# Patient Record
Sex: Male | Born: 1945 | Race: White | Hispanic: No | Smoking: Former smoker
Health system: Southern US, Community
[De-identification: ages and names within clinical notes are randomized; demographics above are authoritative.]

## PROBLEM LIST (undated history)

## (undated) DIAGNOSIS — M109 Gout, unspecified: Secondary | ICD-10-CM

## (undated) DIAGNOSIS — N529 Male erectile dysfunction, unspecified: Secondary | ICD-10-CM

## (undated) DIAGNOSIS — E118 Type 2 diabetes mellitus with unspecified complications: Secondary | ICD-10-CM

## (undated) DIAGNOSIS — C439 Malignant melanoma of skin, unspecified: Secondary | ICD-10-CM

## (undated) DIAGNOSIS — I10 Essential (primary) hypertension: Secondary | ICD-10-CM

## (undated) DIAGNOSIS — G4733 Obstructive sleep apnea (adult) (pediatric): Secondary | ICD-10-CM

## (undated) DIAGNOSIS — Z720 Tobacco use: Secondary | ICD-10-CM

## (undated) DIAGNOSIS — I2510 Atherosclerotic heart disease of native coronary artery without angina pectoris: Secondary | ICD-10-CM

## (undated) DIAGNOSIS — I739 Peripheral vascular disease, unspecified: Secondary | ICD-10-CM

## (undated) HISTORY — DX: Atherosclerotic heart disease of native coronary artery without angina pectoris: I25.10

## (undated) HISTORY — DX: Essential (primary) hypertension: I10

## (undated) HISTORY — DX: Gout, unspecified: M10.9

## (undated) HISTORY — DX: Malignant melanoma of skin, unspecified: C43.9

## (undated) HISTORY — DX: Type 2 diabetes mellitus with unspecified complications: E11.8

## (undated) HISTORY — DX: Tobacco use: Z72.0

## (undated) HISTORY — DX: Obstructive sleep apnea (adult) (pediatric): G47.33

## (undated) HISTORY — DX: Peripheral vascular disease, unspecified: I73.9

## (undated) HISTORY — DX: Male erectile dysfunction, unspecified: N52.9

---

## 2019-06-20 LAB — DERMATOPATHOLOGY: ICD9 Code: 172.3

## 2019-07-23 NOTE — Progress Notes (Signed)
Patient contacted by RN or has completed COVID-19 self-assessment online and screened negative on questionnaires. COVID testing and ambulatory referral to COVID testing appointment placed.      Schedule COVID testing on 9/18 or 9/19 (3-4 days prior to pre-procedure/admission date) for Dermatology procedure scheduled on 08/02/19.

## 2019-07-29 ENCOUNTER — Ambulatory Visit: Admit: 2019-07-29 | Discharge: 2019-07-29 | Payer: MEDICARE

## 2019-07-29 DIAGNOSIS — Z1159 Encounter for screening for other viral diseases: Secondary | ICD-10-CM

## 2019-07-30 LAB — COVID-19 RNA, RT-PCR/NUCLEIC A: COVID-19 RNA, RT-PCR/Nucleic A: NOT DETECTED

## 2019-08-02 ENCOUNTER — Ambulatory Visit: Admit: 2019-08-02 | Discharge: 2019-08-02 | Payer: MEDICARE

## 2019-08-02 DIAGNOSIS — Z0389 Encounter for observation for other suspected diseases and conditions ruled out: Secondary | ICD-10-CM

## 2019-08-02 DIAGNOSIS — D0339 Melanoma in situ of other parts of face: Secondary | ICD-10-CM

## 2019-08-02 NOTE — Patient Instructions (Signed)
[facility] PATIENT INSTRUCTIONS     CARING FOR YOUR SURGICAL WOUND SITE WITH STERI-STRIPS    1) Your surgical wound will be covered by a pressure dressing.  Remove the pressure dressing after 24 hours.  If you are taking blood thinners, remove the dressing after 48 hours    2) Steri-strips were placed over the wound.  You can replace these only as needed.  You will NOT need to clean the wound with hydrogen peroxide or apply petrolatum.  In case of dissolvable sutures are used, steri-strips can be removed after 7-10 days     3) Do not get your wound wet for the first 24 hours.  The wound and steri-stips can get wet in the shower but dont let the direct stream of the shower hit the wound (indirect gentle stream from the shower is fine)     4) After 24-48 hours, the wound can get wet in the shower, but it should not be submerged in water as long as the sutures are in     5) If you have discomfort following surgery, take Tylenol (acetaminophen) or Advil (ibuprofen) as directed    6) Rest.  Avoid strenuous exercise, bending, straining, stopping or lifting heavy objects during the first few days after surgery.  Activities that increase blood pressure may cause bleeding at the wound site    7) You may apply an ice pack for 10-15 minutes of every hour while awake for the first 24-48 hours after surgery.  This can be applied directly over the pressure dressing and can help keep the swelling/bruising down    8) Be aware that swelling and bruising can be most pronounced on the 3rd day    9) Keeping your wound elevated for the first 24 hours can also help reduce swelling    10) Your wound may begin to drain fluid or bleed within the first few hours of surgery.  If significant bleeding occurs that soaks the dressing or leaks from the dressing, remove the dressing and apply direct pressure to the bleeding site with clean gauze or dressing.  Keep constant pressure on the site for 15 minutes without removing the new  dressing.  If bleeding continues after two 15-minute cycles of applied pressure, call the number below    11) Follow-up as directed for suture removal or wound checks with your doctor    12) If you have any problem or require additional information, call [phone].  If you need urgent assistance after office hours, identify yourself as a dermatology patient, tell the operator the name of your dermatologist, and ask for the doctor on call to be paged.  The doctor will call you back

## 2019-08-02 NOTE — Progress Notes (Signed)
Chief Complaint   Patient presents with    Mohs Surgery     MIS L NASAL SIDEWALL      Juan Roberts is a 73 y.o. male who presents today with a several month history of a lesion on the left nasal ala.    The patient describes the following features of the lesion which led to a referral for evaluation: unusual color    Prior treatments attempted: None    The patient is referred by Ravinder Towanda Octave, MD to the [facility] for evaluation and management. 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    Prior history of skin cancer:  Melanoma Yes   Nonmelanoma skin cancer Yes     Tobacco use:  reports that he has quit smoking. He does not have any smokeless tobacco history on file.    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  History of Hepatitis, HIV, Immunosuppression: None      Review of Systems  Constitutional: Negative for appetite change, chills, fatigue, malaise, sweats, and unexpected weight change  Eyes: Negative for visual disturbance   Neurological: Negative for facial asymmetry    Physical Examination:  General appearance: Well-appearing, well nourished, with no obvious deformities  Orientation: Patient oriented to person, place, and time  Mood and Affect: No evidence of depression, mania, or agitation    Physical exam was significant for a 0.9 x 0.6 cm pink depressed macule in the site of prior biopsy on left nasal ala    The remainder of the skin elements as noted below were examined and without concerning findings:  Head  Neck  Right Upper Extremity  Left Upper Extremity  Hair  Eyes  Lymph nodes: no regional lymphadenopathy noted    Recent Dermatopathology  (Last 90 days)    Date/Time Component Value    06/15/19 0000 ICD9 Code 172.3    06/15/19 0000 Clinical Text MORPHOLOGY: PIGMENTED MACULE, DDX: NEOPLASM OF UNCERTAIN BEHAVIOR VS. LENTIGO VS. 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      06/15/19 0000 Diagnosis Comment The Melan-A component a PRAME/Melan-A multiplex immunostain highlights an irregular distribution of single melanocytes along the dermal-epidermal junction with extension along follicular epithelium, and shows strong, uniform staining of the nuclei of the   lesional melanocytes for PRAME, favoring early melanoma in situ.  The proliferation extends to the peripheral tissue edge.      06/15/19 0000 Diagnosis Comment --    06/15/19 0000 Diagnosis Comment The case has been reviewed with Dr. Ellie Lunch who concurs with the diagnosis of melanoma in situ.    06/15/19 0000 Diagnosis Comment --    06/15/19 0000 Diagnosis Comment The findings were discussed with Dr. Wille Glaser on 06/20/2019.    06/15/19 0000 Diagnosis Comment --    06/15/19 0000 Diagnosis Comment --    06/15/19 0000 Diagnosis Comment --    06/15/19 0000 Gross Text Skin segment measuring 0.5 x 0.4 x 0.1 cm, bisected and entirely submitted. CF/KB    06/15/19 0000 Microscopic Description Sections show an irregular, disorganized intraepidermal prolfieration of mostly single melanocytes along the dermal-epidermal junction, which is partially effaced in some areas, and extending along follicular epithelium as well.  There is prominent solar   elastosis in the subjacent superficial dermis. The lesion extends to the peripheral  tissue edge.      06/15/19 0000 Case Comment THE IMMUNOPEROXIDASE STAINING OR IN SITU HYBRIDIZATION TESTING DELINEATED ABOVE WAS DEVELOPED AND THE PERFORMANCE CHARACTERISTICS OF THIS TESTING WERE DETERMINED AT THE [facility] OR AT THE [facility]   DEPARTMENT OF PATHOLOGY. THIS TYPE OF TESTING HAS NOT BEEN APPROVED BY THE [place] [employer], ALTHOUGH THE FDA HAS DETERMINED THAT APPROVAL OR CLEARANCE IS NOT NECESSARY FOR  SUCH TESTING TO BE PERFORMED. THIS TESTING IS USED FOR   CLINICAL PURPOSES AND SHOULD NOT BE CONSIDERED INVESTIGATIONAL OR RESEARCH-RELATED. OUR LABORATORY IS REGULATED UNDER THE CLINICAL LABORATORY IMPROVEMENT ACT (CLIA) AND IS QUALIFIED TO PERFORM HIGH COMPLEXITY CLINICAL TESTING OF THIS TYPE.            Assessment and Plan    1. 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  Various treatment options, including Mohs micrographic surgery, wide local excision, curettage and electrodesiccation, and topical immunomodulators, were discussed with the patient.  Based on the anatomic location, histology and tumor size, Mohs micrographic surgery was recommended.  The technique, benefits, risks, and alternatives, including but not limited to infection, bleeding, recurrence, nerve injury, and scar, were discussed with the patient.  Following the opportunity to answer all his questions, the decision was made to treat his skin cancer with Mohs micrographic surgery.  Mohs AUC score is 7.    Counseling:  Risks and benefits of surgery were discussed. The patient wished to proceed with Mohs micrographic surgery as outlined above.     OPERATIVE NOTE  Mohs Case #:    [id number]    This 73 y.o. patient was referred for the treatment of primary melanoma in situ located on the left nasal ala.  Mohs surgery is indicated due to anatomic location and histology.  The preoperative size was 0.9 x 0.6 cm. The technique, its benefits, alternatives, and risks were discussed with the patient.  The patient was identified per protocol, appropriate time-out was performed, and the location of the lesion was confirmed per site identification protocol, including confirmation with the patient.  After informed consent and appropriate instruction, the patient underwent Mohs surgery using fresh tissue technique as follows.    The visible tumor was debulked and sent to  pathology for vertical sections. As the biopsy was only a partial sampling of a larger pigmented lesion, the full thickness/depth of the tumor may have been under-reported on the biopsy specimen. Mohs surgery represents an appropriate treatment for melanoma in situ. However, if the debulked tumor demonstrates invasion upon review of vertical sections by dermatopathology, a further excision with 1cm margins and potentially a sentinel lymph node biopsy may be indicated. Moreover, the presence or absence of invasive melanoma would also provide prognostic information. As recently reported by [facility], evaluation of debulking specimens with vertical sections resulted in upstaging of the tumor in more than 8% of cases. (Dermatol Surg. 2013 Mar;39(3 Pt 1):365-71)    Excisional biopsy: Consent was obtained for a diagnostic biopsy of 1 (qty) lesions located on the left nasal ala.  The area(s) were cleansed and anesthetized with 1% lidocaine with epi, and visible tumor was excised for diagnostic purposes.  The specimen(s) were sent for histopathologic evaluation.  Mohs surgery was then performed to clear the margins (see procedure note). The attending was present and directly participated in the entire procedure.  STAGE I:  The patient was placed supine on the operative table.  The lesion was defined and infiltrated with 1% buffered lidocaine with epinephrine.  An initial Mohs excision was made 5 mm from the tumor margins as defined by Wood's lamp.  Hemostasis was obtained by spot electrodesiccation and a dressing was placed.  The tissue was submitted as 1 section(s) which was mapped, color coded at their margins, and sent to the technician who produced frozen section slides using H&E and MART1 staning.  Histologic exam of these slides was performed by the Mohs surgeon, and no microscopic tumor identified.  Following complete analysis of all frozen sections, microscopic tumor was found persisting in 0  specimen(s).    With  the lesions clear of microscopic tumor, surgery was considered complete.  Following surgery, the defect measured as follows: 1.0 x 1.1 cm.     CONDITION AT TERMINATION OF THERAPY:  Carcinoma removed.    The attending was present and directly participated in the entire procedure    RECONSTRUCTION NOTE    The patient was left with the above described defect  following Mohs surgery. We discussed various closure modalities with the patient, including healing by second intention, primary closure, skin graft and various flaps, and felt that the location and configuration of the defect indicated that a rotation flap would result in the least disturbance of the position and function of the surrounding anatomic structures and provide the best result.  The technique, its benefits, alternatives and risks were discussed with the patient.  Appropriate instructions were given, informed consent was obtained, and then the patient underwent the procedure as follows:    PROCEDURE:  The patient was taken to the operative suite and placed supine on the operating room table.  The area of the defect and the surrounding skin were anesthetized with buffered 1% lidocaine with epinephrine.  The area was washed with chlorhexidine.  Sterile drapes were applied.      The wound edges were debeveled and extensively undermined.  Hemostasis was achieved with spot electrodesiccation.  A rotation flap was designed, incised, and elevated.  The flap was rotated into position to cover the primary defect and a 5-0 Vicryl buried key suture was used to secure the flap in place.  Further dermal wound apposition was achieved using 5-0 Vicryl buried interrupted sutures.  The standing cone created at the point of rotation was removed to fat by triangulation.  Final cutaneous approximation was achieved with 6-0 Prolene sutures.  The closure was manually tested and found to be sound for strength and hemostasis.  The final post-operative defect size, which  included both the primary defect and the secondary flap defect, was 8.1 sq. cm..    A sterile dressing was applied.  Postoperative wound care instructions, along with a written handout, were given to the patient.  The patient was discharged from the [facility] and [facility] alert and ambulatory.      FINAL PROCEDURE:  Rotation flap    COMPLICATIONS:  None      The attending was present and directly participated in the entire procedure.     The tissue processing, slide preparation, and pathologic analysis of the frozen tissue during the Mohs surgery was performed at the [facility], [address] [place], [place], [address].   Jerene Bears. Grekin MD, Laboratory Director           Follow up 1 week for suture removal

## 2019-08-03 LAB — DERMATOPATHOLOGY: ICD9 Code: 172.3

## 2019-08-09 ENCOUNTER — Ambulatory Visit: Admit: 2019-08-09 | Discharge: 2019-08-09 | Payer: MEDICARE

## 2019-08-09 DIAGNOSIS — Z5189 Encounter for other specified aftercare: Secondary | ICD-10-CM

## 2019-08-09 NOTE — Progress Notes (Signed)
Chief Complaint   Patient presents with    Suture / Staple Removal     MFU- MIS L nasal wall- DOS 08/02/19     Juan Roberts is a 73 y.o. male s/p Mohs surgery for melanoma in situ on the left nasal ala on 08/02/19 who presents today in follow up. The defect was repaired with rotation flap. Today, the patient reports the wound has continued to heal well. Reports no bleeding, tenderness, purulence, or other issues.     Denies other new, changing, or symptomatic skin lesions.     Prior history of skin cancer:  Melanoma Yes   Nonmelanoma skin cancer Yes       Review of Systems  Constitutional: Negative for appetite change, chills, fatigue, malaise, sweats, and unexpected weight change  Neurological: Negative for facial asymmetry    Physical Examination:  General appearance: Well-appearing, well nourished, with no obvious deformities  Orientation: Patient oriented to person, place, and time  Mood and Affect: No evidence of depression, mania, or agitation    Physical exam was significant for a well healing surgical scar on the left nose with no erythema, tenderness, or purulence. Superficial crusting at the inferior aspect    The remainder of the skin elements as noted below were examined and without concerning findings:  Head  Neck  Hair  Eyes    Assessment and Plan    1. Melanoma in situ on left nasal ala s/p Mohs surgery on 08/02/19 with rotation flap:    - The wound is well healed without signs of infection.    - Wound care and activity instructions reinforced.    - No further intervention needed at this time.    - Continue routine skin checks with the referring dermatologist.    - Discussed the potential that the inferior edge may be thickened after surgical scar is healed and may be treated with ILK if needed   - Return prn    Counseling:  Discussed assessment and plan with the patient

## 2019-08-09 NOTE — Addendum Note (Signed)
Addended by: Danella Penton on: 08/16/2019 04:59 PM     Modules accepted: Level of Service

## 2019-08-30 ENCOUNTER — Ambulatory Visit: Admit: 2019-08-30 | Discharge: 2019-08-30 | Payer: MEDICARE

## 2019-08-30 DIAGNOSIS — Z5189 Encounter for other specified aftercare: Secondary | ICD-10-CM

## 2019-08-30 NOTE — Progress Notes (Signed)
S:   Juan Roberts is a 73 y.o. male s/p Mohs surgery for melanoma in situ on the left nasal ala on 08/02/19 who presents today in follow up. Not doing wound care. There is a thick scab on distal nose and he has been leaving it dry.     O:  Distal rotation flap on nasal dorsum with crust overlying granulating wound. Otherwise flap is warm and well perfused, with intact scars.     A/P:  Scar  Melanoma in situ on left nasal ala s/p Mohs surgery on 08/02/19 with rotation flap.  Superficial sloughing of distal rotation flap noted, and this area is now healing by secondary intention.    -Discussed local wound care, including vaseline BID to keep secondary intention area moist  -no evidence of infection    F/U 3 weeks for wound check

## 2019-09-20 ENCOUNTER — Ambulatory Visit: Admit: 2019-09-20 | Discharge: 2019-09-20 | Payer: MEDICARE

## 2019-09-20 DIAGNOSIS — Z4889 Encounter for other specified surgical aftercare: Secondary | ICD-10-CM

## 2019-09-20 DIAGNOSIS — L910 Hypertrophic scar: Secondary | ICD-10-CM

## 2019-09-20 NOTE — Progress Notes (Signed)
Subjective   Chief Complaint   Patient presents with    Wound Check     [facility] - MIS - Left Nasal Ala - Rotation Flap - 7 weeks     History of Present Illness:  Juan Roberts is a 73 y.o. male with HPI as follows:    s/p Mohs surgery for melanoma in situon the left nasal alaon 9/22/20who presents today in follow up.    Flap is healing well. Mild thickening present. No pain, bleeding or signs of infection reported.     Personal Skin Cancer History   Melanoma Yes   Nonmelanoma skin cancer Yes     Details of Personal Skin Cancer Hx     Diagnosis Date Comment Source    Melanoma (CMS code)       Nonmelanoma skin cancer           Family Skin Cancer Hx     Problem Relation (Age of Onset)    Squamous cell carcinoma Neg Hx        Patient's allergies, medications, past medical, surgical, family and social histories were reviewed and updated as appropriate.    Review of Systems   Constitutional: Negative for chills and fever.   Eyes: Negative for visual disturbance.    HENT: Negative for congestion.    Respiratory: Negative for cough.           Objective   Physical Examination:  Skin elements examined without findings:    Neck    R Upper Extremity    L Upper Extremity    Hair    Eyes (Conjunctiva and Lids)    Lips, Teeth, & Gums  Skin elements examined with findings:    Head.  Skin exam findings:    Distal rotation flap on nasal dorsum, well-healed, with mild hypertrophy. Otherwise warm and well perfused.     Additional Exam Findings    General Appearance negative: Well-appearing, well-nourished, with no obvious deformities.     Orientation negative: Oriented to time, place and person.     Mood & Affect negative: No evidence of depression, mania, anxiety, or agitation.     Data Reviewed:  Patient's relevant lab, radiology, micro, and pathology results were reviewed as appropriate.       Assessment   Assessment and Plan (numbered by problem):    1. Melanoma in situon left nasal alas/p Mohs surgeryon 08/02/19 with rotation  flap.  -Flap is well-healed, warm and well perfused.   -Mild hypertrophy noted; patient amenable to treatment with ILK today. See Procedure note below.   -Will return for ErYAG resurfacing/scar revision in a few months.     Minor Procedure Notes (if applicable):  Intralesional Injection: After discussing the R/B/SE/Alt, the patient's hypertrophic rotation flap located on the nose was treated with intralesional kenalog.    Total number of injection sites: 1  Concentration(s) of medication injected: 40 mg/mL  Total volume injected: 0.35 mL  The attending was present and directly participated in the entire procedure.    Counseling:  Assessment and Plan were discussed.  Gentle skin care education discussed

## 2019-11-21 ENCOUNTER — Ambulatory Visit: Admit: 2019-11-21 | Discharge: 2019-11-21 | Payer: MEDICARE

## 2019-11-21 DIAGNOSIS — Z4889 Encounter for other specified surgical aftercare: Secondary | ICD-10-CM

## 2019-11-21 DIAGNOSIS — L910 Hypertrophic scar: Secondary | ICD-10-CM

## 2019-11-21 NOTE — Patient Instructions (Signed)
[facility] PATIENT INSTRUCTIONS     CARING FOR YOUR LASER SITE     1) Swelling and redness of the treated area is extremely common after laser treatment.  Cold packs and keeping the treated area elevated can help to reduce swelling.  This usually resolves in 2-3 days, but may last longer in areas that have been extensively treated    2) Rarely, you may experience crusting or blistering after laser treatment.  If you have any of these symptoms, use a greasy emollient such as petrolatum or Aquaphor 2-3 times a day until the skin has healed.  Until the crusting has resolved, do not use make-up, sunscreen, or topical medications unless directed by your doctor.  It is important to not rub, scratch, or pull off any crust or scabs that occur    3) You can shower and clean the treated area with a gentle cleanser.  Do not rub treated area with face cloth or towel as this may irritate the skin.  Pat the area gently to dry    4) Laser treatment may increase sun sensitivity.  Protect treated areas by using sunscreen, protective clothing, and hats in order to maximize treatment results    5) For discomfort, take acetaminophen (Tylenol) as directed by the instructions on the package    6) If you have any problem or require additional information, call [phone].  If you need urgent assistance after office hours, identify yourself as a dermatology patient, tell the operator the name of your dermatologist, and ask for the doctor on call to be paged.  The doctor will call you back

## 2019-11-21 NOTE — Addendum Note (Signed)
Addended by: Dorinda Hill on: 11/21/2019 06:19 PM     Modules accepted: Level of Service

## 2019-11-21 NOTE — Progress Notes (Signed)
Subjective   Chief Complaint   Patient presents with    Laser Treatment     scar on nose      History of Present Illness:  Juan Roberts is a 74 y.o. male with HPI as follows:    Patient is s/p MMS for MIS on 08/02/19, repaired with rotation flap. Well healed, though some hypertrophy noted, s/p ILK. Plan for Er:YAG scar revision today.     Personal Skin Cancer History   Melanoma Yes   Nonmelanoma skin cancer Yes     Details of Personal Skin Cancer Hx     Diagnosis Date Comment Source    Melanoma (CMS code)       Nonmelanoma skin cancer           Family Skin Cancer Hx     Problem Relation (Age of Onset)    Squamous cell carcinoma Neg Hx        Patient's allergies, medications, past medical, surgical, family and social histories were reviewed and updated as appropriate.    Review of Systems   Constitutional: Negative.    Skin/Breast: Negative for skin pruritus.   Allergic/Immunologic: Negative for frequent infections.        Objective   Physical Examination:  Skin elements examined without findings:    Neck  Skin elements examined with findings:    Head.  Skin exam findings:    Distal rotation flap on nasal dorsum, well-healed, with mild hypertrophy. Most bothered by obvious transition from flap to skin on lateral nose.    Additional Exam Findings    General Appearance negative: Well-appearing, well-nourished, with no obvious deformities.     Orientation negative: Oriented to time, place and person.     Mood & Affect negative: No evidence of depression, mania, anxiety, or agitation.     Data Reviewed:          Assessment   Assessment and Plan (numbered by problem):    1. Hypertrophic scar, s/p MMS on left nose for MIS, repaired with rotation flap  - Plan for Er:YAG today  -- Discussed with patient that the most effective treatment for this hypertrophic scar would be ablative laser treatment (Er:YAG), which results in dramatic improvement and blending of the scar. We reviewed the procedure in detail, including the  technique, downtime, and expected results. Discussed risks of treatment including pain, erythema, erosions, milia formation, dyschromia, reactivation of orolabial herpes, and in rare cases, scarring. The expected down time is about 10 days. Discussed with patient that this area may be erythematous for several months after the procedure, but will gradually fade and can be covered with make-up.       ERBIUM YAG LASER NOTE: FULLY ABLATIVE     Laser: Er:YAG fully ablative resurfacing   Diagnosis: hypertrophic scar on nose s/p MMS and rotation flap repair     Skin preparation: alcohol  Eye protection: shields  Anesthesia: 1% lidocaine with epinephrine nerve blocks and skin infiltration     Location verified: Yes  Techniques, risks, benefits, alternatives reviewed: Yes  Consent signed, witnessed, and dated: Yes     Laser Settings:   Wavelength: 2940 nm  Depth of ablation: 100 microns  Fluence: 25 J/cm2  Rate: 4.0 hz  Spot size 47mm   Coag: 50      Procedure:  A total of three passes were delivered to the left nose, extending ~31mm along either side of the scar. Areas were treated to an endpoint of pinpoint bleeding and  improvement in contour.     Immediate post-laser care: Vaseline and tefla to treated area on nose  Complications: none  Post-op medication: none  Wound care instructions given: Yes    RTC in 2 weeks for f/u

## 2019-12-05 ENCOUNTER — Ambulatory Visit: Admit: 2019-12-05 | Discharge: 2019-12-05 | Payer: MEDICARE

## 2019-12-05 DIAGNOSIS — Z4889 Encounter for other specified surgical aftercare: Secondary | ICD-10-CM

## 2019-12-05 DIAGNOSIS — L905 Scar conditions and fibrosis of skin: Secondary | ICD-10-CM

## 2019-12-05 DIAGNOSIS — L989 Disorder of the skin and subcutaneous tissue, unspecified: Secondary | ICD-10-CM

## 2019-12-05 NOTE — Progress Notes (Signed)
Chief Complaint   Patient presents with    Wound Check     MFU - 4 month post op - MIS L nasal ala - Rotation flap     Juan Roberts is here for f/u s/p fractional Er:YAG to left nasal ala on 11/21/2019. Patient s/p MMS for MIS repaired with rotation flap. Was applying vaseline for the first week, but then stopped. A little crusting along the edge. Pleased with results, feels that the contour is much improved.     Objective:  - Contour improved along left nasal ala  - Crust noted along alar crease  - Pink-red eythema in area of treatment    Assessment and Plan:    1. Wound check, s/p Er:YAG to left nasal ala on 11/21/2019, scar revision  - Continue vaseline to area of crust, daily until healed  - If erythema does not resolve, consider PDL in next 6-8 weeks  - Reiterated the important of sun protection in the treatment area    RTC as needed

## 2019-12-05 NOTE — Addendum Note (Signed)
Addended by: Taivon Haroon C on: 12/08/2019 01:54 PM     Modules accepted: Level of Service

## 2020-12-12 ENCOUNTER — Other Ambulatory Visit: Payer: Self-pay

## 2020-12-12 ENCOUNTER — Ambulatory Visit (INDEPENDENT_AMBULATORY_CARE_PROVIDER_SITE_OTHER): Payer: Medicare Other | Admitting: Cardiology

## 2020-12-12 ENCOUNTER — Other Ambulatory Visit: Payer: Self-pay | Admitting: Pharmacist

## 2020-12-12 ENCOUNTER — Encounter: Payer: Self-pay | Admitting: Cardiology

## 2020-12-12 VITALS — BP 140/80 | HR 57 | Ht 71.0 in | Wt 207.6 lb

## 2020-12-12 DIAGNOSIS — E7800 Pure hypercholesterolemia, unspecified: Secondary | ICD-10-CM | POA: Diagnosis not present

## 2020-12-12 DIAGNOSIS — E785 Hyperlipidemia, unspecified: Secondary | ICD-10-CM

## 2020-12-12 DIAGNOSIS — I2510 Atherosclerotic heart disease of native coronary artery without angina pectoris: Secondary | ICD-10-CM

## 2020-12-12 DIAGNOSIS — I739 Peripheral vascular disease, unspecified: Secondary | ICD-10-CM

## 2020-12-12 DIAGNOSIS — E119 Type 2 diabetes mellitus without complications: Secondary | ICD-10-CM | POA: Diagnosis not present

## 2020-12-12 DIAGNOSIS — I10 Essential (primary) hypertension: Secondary | ICD-10-CM | POA: Diagnosis not present

## 2020-12-12 MED ORDER — HYDROCHLOROTHIAZIDE 25 MG PO TABS: 25.0000 mg | ORAL_TABLET | Freq: Every day | ORAL | 3 refills | Status: DC

## 2020-12-12 MED ORDER — EVOLOCUMAB 140 MG/ML ~~LOC~~ SOAJ: 1.0000 mL | SUBCUTANEOUS | 3 refills | Status: DC

## 2020-12-12 NOTE — Patient Instructions (Signed)
Medication Instructions:  Your physician has recommended you make the following change in your medication:   INCREASE: Hydrochlorothiazide to 25 mg once a day  *If you need a refill on your cardiac medications before your next appointment, please call your pharmacy*   Lab Work: None  If you have labs (blood work) drawn today and your tests are completely normal, you will receive your results only by: Marland Kitchen MyChart Message (if you have MyChart) OR . A paper copy in the mail If you have any lab test that is abnormal or we need to change your treatment, we will call you to review the results.   Testing/Procedures: Your physician has requested that you have an echocardiogram. Echocardiography is a painless test that uses sound waves to create images of your heart. It provides your doctor with information about the size and shape of your heart and how well your heart's chambers and valves are working. This procedure takes approximately one hour. There are no restrictions for this procedure.   Follow-Up: At [facility], you and your health needs are our priority.  As part of our continuing mission to provide you with exceptional heart care, we have created designated Provider Care Teams.  These Care Teams include your primary Cardiologist (physician) and Advanced Practice Providers (APPs -  Physician Assistants and Nurse Practitioners) who all work together to provide you with the care you need, when you need it.  We recommend signing up for the patient portal called "MyChart".  Sign up information is provided on this After Visit Summary.  MyChart is used to connect with patients for Virtual Visits (Telemedicine).  Patients are able to view lab/test results, encounter notes, upcoming appointments, etc.  Non-urgent messages can be sent to your provider as well.   To learn more about what you can do with MyChart, go to [email or web address].    Your next appointment:   6 month(s)  The format  for your next appointment:   In Person  Provider:   You may see Gwyndolyn Kaufman, MD or one of the following Advanced Practice Providers on your designated Care Team:    Richardson Dopp, PA-C  Robbie Lis, [place]    Other Instructions None

## 2020-12-12 NOTE — Progress Notes (Signed)
Cardiology Office Note:    Date:  12/12/2020   ID:  Tellis Dossey, DOB 1946/08/27, MRN [id number]  PCP:  [name], MD  [facility] Cardiologist:  No primary care provider on file.  [facility] Electrophysiologist:  None   Referring MD: [name], MD    History of Present Illness:    Kristopher Rowe is a 75 y.o. male with a hx of CAD s/p remote history of PCI to RCA and OM1, DMII, HTN, PAD s/p PTA and stent of left common iliac (2016), prior tobacco abuse, OSA, and gout who was referred by Dr. Mannie Stabile for further management of underlying CAD.  Patient recently moved to [place] from [place] and is looking to establish care. Has extensive coronary history with history of RCA stent and OM1 in 2012. Also has left common iliac stenting in 2016 for severe claudication. Has been doing well since that time. No exertional chest pain, SOB, claudication, or lightheadedness. Plays raquetball ball 5-6 times per week without issues. No lightheadedness, dizziness, syncope. Occasional LE edema. No bleeding issues.   Blood pressure has been elevated in 140s. On valsartan 160mg , atenolol 50mg  daily, and HCTZ 12.5mg . Does not know the last time he had an echo.   Past Medical History:  Diagnosis Date  . CAD (coronary artery disease)   . DM (diabetes mellitus) with complications ([place])   . ED (erectile dysfunction)   . Gout   . Hypertension   . Melanoma ([place])   . OSA (obstructive sleep apnea)   . PAD (peripheral artery disease) ([place])   . Tobacco use     History reviewed. No pertinent surgical history.  Current Medications: Current Meds  Medication Sig  . allopurinol (ZYLOPRIM) 300 MG tablet Take 300 mg by mouth daily.  Marland Kitchen aspirin EC 81 MG tablet Take 81 mg by mouth daily. Swallow whole.  Marland Kitchen atenolol (TENORMIN) 50 MG tablet Take 50 mg by mouth daily.  . celecoxib (CELEBREX) 50 MG capsule Take 50 mg by mouth 2 (two) times daily.  . cholecalciferol (VITAMIN D) 25 MCG (1000 UNIT) tablet  Take 1,000 Units by mouth daily.  . fenofibrate 160 MG tablet Take 160 mg by mouth daily.  . metFORMIN (GLUCOPHAGE) 500 MG tablet Take by mouth 2 (two) times daily with a meal.  . valsartan (DIOVAN) 160 MG tablet Take 160 mg by mouth daily.  . varenicline (CHANTIX) 1 MG tablet Take 1 mg by mouth 2 (two) times daily.  . [DISCONTINUED] Evolocumab (REPATHA [place]) Inject into the skin. Per patient taking every 2 weeks  . [DISCONTINUED] hydrochlorothiazide (HYDRODIURIL) 12.5 MG tablet Take 12.5 mg by mouth daily.     Allergies:   Penicillins   Social History   Socioeconomic History  . Marital status: Divorced    Spouse name: Not on file  . Number of children: Not on file  . Years of education: Not on file  . Highest education level: Not on file  Occupational History  . Not on file  Tobacco Use  . Smoking status: Former Research scientist (life sciences)  . Smokeless tobacco: Never Used  Substance and Sexual Activity  . Alcohol use: Not on file  . Drug use: Not on file  . Sexual activity: Not on file  Other Topics Concern  . Not on file  Social History Narrative  . Not on file   Social Determinants of Health   Financial Resource Strain: Not on file  Food Insecurity: Not on file  Transportation Needs: Not on file  Physical Activity: Not on file  Stress: Not on file  Social Connections: Not on file     Family History: The patient's family history includes Hodgkin's lymphoma in his father; Other in his mother.  ROS:   Please see the history of present illness.    Review of Systems  Constitutional: Negative for chills and fever.  HENT: Negative for nosebleeds.   Eyes: Negative for blurred vision.  Respiratory: Negative for shortness of breath.   Cardiovascular: Negative for chest pain, palpitations, orthopnea, claudication, leg swelling and PND.  Gastrointestinal: Negative for melena, nausea and vomiting.  Genitourinary: Negative for hematuria.  Musculoskeletal: Positive for joint pain.   Neurological: Negative for dizziness and loss of consciousness.  Psychiatric/Behavioral: Negative for substance abuse.    EKGs/Labs/Other Studies Reviewed:    The following studies were reviewed today: Patient brought records today which were reviewed.  2016: Left iliac artery stenting 1999: RCA stent 2012: OM1 stent 2009: Myoview with inferior infarct but no ischemia  EKG:  EKG is  ordered today.  The ekg ordered today demonstrates sinus bradycardia with HR 57; inferior q-waves  Recent Labs: No results found for requested labs within last 8760 hours.  Recent Lipid Panel No results found for: CHOL, TRIG, HDL, CHOLHDL, VLDL, LDLCALC, LDLDIRECT    Physical Exam:    VS:  BP 140/80   Pulse (!) 57   Ht 5\' 11"  (1.803 m)   Wt 207 lb 9.6 oz (94.2 kg)   SpO2 97%   BMI 28.95 kg/m     Wt Readings from Last 3 Encounters:  12/12/20 207 lb 9.6 oz (94.2 kg)     GEN:  Well nourished, well developed in no acute distress HEENT: Normal NECK: No JVD; No carotid bruits CARDIAC: RRR, 1/6 systolic murmur. No rubs, gallops RESPIRATORY:  Clear to auscultation without rales, wheezing or rhonchi  ABDOMEN: Soft, non-tender, non-distended MUSCULOSKELETAL:  No edema; No deformity  SKIN: Warm and dry NEUROLOGIC:  Alert and oriented x 3 PSYCHIATRIC:  Normal affect   ASSESSMENT:    1. Coronary artery disease involving native coronary artery of native heart without angina pectoris   2. Primary hypertension   3. Pure hypercholesterolemia   4. Type 2 diabetes mellitus without complication, without long-term current use of insulin ([place])   5. Peripheral arterial disease (HCC)    PLAN:    In order of problems listed above:  #Multivessel CAD s/p remote history of PCI to RCA and OM1: Previously followed in [place] with history of PCI to RCA and OM1. No current anginal symptoms. He is active and continues to play racquetball 5-6 days per week without issues.  -Check TTE to assess  LVEF -Continue ASA 81mg  daily -Did not tolerate lipitor, crestor or simvastatin in the past; will resume home repatha -Continue atenolol 50mg  daily -Continue Valsartan 160mg  daily  #PAD s/p stenting to left iliac artery: Doing well without claudication symptoms. -Continue ASA and rapatha -Continue healthy lifestyle with diet and exercise  #HTN: Elevated today and has been running high in 140s at home. -Increase HCTZ to 25mg  daily -Continue valsartan 160mg  daily -Continue atenolol 50mg  daily -Discussed importance of low Na diet  #HLD: Has been well controlled on rapatha. Failed more than 3 statins in the past. -Renew script for repatha  #DMII: -Continue metformin  #Tobacco Abuse: -Continue varenicline   Medication Adjustments/Labs and Tests Ordered: Current medicines are reviewed at length with the patient today.  Concerns regarding medicines are outlined above.  Orders Placed This  Encounter  Procedures  . EKG 12-Lead  . ECHOCARDIOGRAM COMPLETE   Meds ordered this encounter  Medications  . hydrochlorothiazide (HYDRODIURIL) 25 MG tablet    Sig: Take 1 tablet (25 mg total) by mouth daily.    Dispense:  90 tablet    Refill:  3    Patient Instructions  Medication Instructions:  Your physician has recommended you make the following change in your medication:   INCREASE: Hydrochlorothiazide to 25 mg once a day  *If you need a refill on your cardiac medications before your next appointment, please call your pharmacy*   Lab Work: None  If you have labs (blood work) drawn today and your tests are completely normal, you will receive your results only by: Marland Kitchen MyChart Message (if you have MyChart) OR . A paper copy in the mail If you have any lab test that is abnormal or we need to change your treatment, we will call you to review the results.   Testing/Procedures: Your physician has requested that you have an echocardiogram. Echocardiography is a painless test that  uses sound waves to create images of your heart. It provides your doctor with information about the size and shape of your heart and how well your heart's chambers and valves are working. This procedure takes approximately one hour. There are no restrictions for this procedure.   Follow-Up: At [facility], you and your health needs are our priority.  As part of our continuing mission to provide you with exceptional heart care, we have created designated Provider Care Teams.  These Care Teams include your primary Cardiologist (physician) and Advanced Practice Providers (APPs -  Physician Assistants and Nurse Practitioners) who all work together to provide you with the care you need, when you need it.  We recommend signing up for the patient portal called "MyChart".  Sign up information is provided on this After Visit Summary.  MyChart is used to connect with patients for Virtual Visits (Telemedicine).  Patients are able to view lab/test results, encounter notes, upcoming appointments, etc.  Non-urgent messages can be sent to your provider as well.   To learn more about what you can do with MyChart, go to [email or web address].    Your next appointment:   6 month(s)  The format for your next appointment:   In Person  Provider:   You may see Gwyndolyn Kaufman, MD or one of the following Advanced Practice Providers on your designated Care Team:    Richardson Dopp, PA-C  Robbie Lis, [place]    Other Instructions None     Signed, Freada Bergeron, MD  12/12/2020 5:08 [place]

## 2020-12-13 ENCOUNTER — Ambulatory Visit: Payer: Self-pay | Admitting: Cardiology

## 2020-12-13 ENCOUNTER — Telehealth: Payer: Self-pay

## 2020-12-13 NOTE — Telephone Encounter (Signed)
alled and lmom pt that we need drug insurance for repatha because it requires a prior [place]

## 2020-12-13 NOTE — Telephone Encounter (Signed)
Called and spoke w/pt stated that they were approved for repatha and tried to get them signed up for [employer] grant but they are required to call for income verification. Pt voiced understanding

## 2020-12-25 ENCOUNTER — Other Ambulatory Visit (HOSPITAL_BASED_OUTPATIENT_CLINIC_OR_DEPARTMENT_OTHER): Payer: Self-pay

## 2020-12-25 DIAGNOSIS — R0681 Apnea, not elsewhere classified: Secondary | ICD-10-CM

## 2020-12-25 DIAGNOSIS — R0683 Snoring: Secondary | ICD-10-CM

## 2020-12-25 DIAGNOSIS — G4710 Hypersomnia, unspecified: Secondary | ICD-10-CM

## 2021-01-01 ENCOUNTER — Encounter (HOSPITAL_COMMUNITY): Payer: Self-pay | Admitting: Cardiology

## 2021-01-01 ENCOUNTER — Other Ambulatory Visit (HOSPITAL_COMMUNITY): Payer: Medicare Other

## 2021-01-08 ENCOUNTER — Other Ambulatory Visit (HOSPITAL_COMMUNITY): Payer: Medicare Other

## 2021-01-24 ENCOUNTER — Ambulatory Visit (HOSPITAL_COMMUNITY): Payer: Medicare Other | Attending: Cardiology

## 2021-01-24 ENCOUNTER — Other Ambulatory Visit: Payer: Self-pay

## 2021-01-24 DIAGNOSIS — I2510 Atherosclerotic heart disease of native coronary artery without angina pectoris: Secondary | ICD-10-CM | POA: Diagnosis present

## 2021-01-24 LAB — ECHOCARDIOGRAM COMPLETE
Area-P 1/2: 1.78 cm2
S' Lateral: 3.5 cm

## 2021-01-25 ENCOUNTER — Telehealth: Payer: Self-pay | Admitting: Cardiology

## 2021-01-25 NOTE — Telephone Encounter (Signed)
Patient returning phone call for Echo results

## 2021-01-25 NOTE — Telephone Encounter (Signed)
Pt has been notified of echo results by phone with verbal understanding. Pt thanked me for the call.

## 2021-02-07 ENCOUNTER — Other Ambulatory Visit: Payer: Self-pay

## 2021-02-07 ENCOUNTER — Ambulatory Visit (HOSPITAL_BASED_OUTPATIENT_CLINIC_OR_DEPARTMENT_OTHER): Payer: Medicare Other | Attending: Internal Medicine | Admitting: Internal Medicine

## 2021-02-07 DIAGNOSIS — G4710 Hypersomnia, unspecified: Secondary | ICD-10-CM

## 2021-02-07 DIAGNOSIS — R0683 Snoring: Secondary | ICD-10-CM | POA: Diagnosis present

## 2021-02-07 DIAGNOSIS — Z8669 Personal history of other diseases of the nervous system and sense organs: Secondary | ICD-10-CM | POA: Insufficient documentation

## 2021-02-07 DIAGNOSIS — G4733 Obstructive sleep apnea (adult) (pediatric): Secondary | ICD-10-CM | POA: Insufficient documentation

## 2021-02-07 DIAGNOSIS — G4730 Sleep apnea, unspecified: Secondary | ICD-10-CM | POA: Diagnosis present

## 2021-02-07 DIAGNOSIS — R0681 Apnea, not elsewhere classified: Secondary | ICD-10-CM | POA: Insufficient documentation

## 2021-02-10 NOTE — Procedures (Signed)
   NAME: Kristopher Rowe DATE OF BIRTH:  01-28-1946 MEDICAL RECORD NUMBER [id number]  LOCATION: [facility]  PHYSICIAN: Marius Ditch  DATE OF STUDY: 02/07/2021  SLEEP STUDY TYPE: Nocturnal Polysomnogram               REFERRING PHYSICIAN: Loma Sousa, MD  EPWORTH SLEEPINESS SCORE:  8 HEIGHT: 5\' 11"  (180.3 cm)  WEIGHT: 205 lb (93 kg)    Body mass index is 28.59 kg/m.  NECK SIZE: 17 in.  CLINICAL INFORMATION The patient was referred to the sleep center for evaluation of witnessed apnea, snoring, rare awakening gasping for breath and excessive daytime sleepiness. He was diagnosed many years ago with OSA and was on CPAP for a period of time  MEDICATIONS No sleep medicine administered.Marland Kitchen  SLEEP STUDY TECHNIQUE A multi-channel overnight Polysomnography study was performed. The channels recorded and monitored were central and occipital EEG, electrooculogram (EOG), submentalis EMG (chin), nasal and oral airflow, thoracic and abdominal wall motion, anterior tibialis EMG, snore microphone, electrocardiogram, and a pulse oximetry.  TECHNICAL COMMENTS Comments added by Technician: Patient was restless all through the night. Comments added by Scorer: N/A  SLEEP ARCHITECTURE The study was initiated at 11:01:48 PM and terminated at 5:14:39 AM. The total recorded time was 372.8 minutes. EEG confirmed total sleep time was 150.5 minutes yielding a sleep efficiency of 40.4%%. Sleep onset after lights out was 26.8 minutes with a REM latency of 141.5 minutes. The patient spent 38.9%% of the night in stage N1 sleep, 52.8%% in stage N2 sleep, 0.0%% in stage N3 and 8.3% in REM. Wake after sleep onset (WASO) was 195.5 minutes. The Arousal Index was 50.6/hour.  RESPIRATORY PARAMETERS There were a total of 56 respiratory disturbances out of which 22 were apneas ( 22 obstructive, 0 mixed, 0 central) and 34 hypopneas. The apnea/hypopnea index (AHI) was 22.3 events/hour. The central sleep  apnea index was 0 events/hour. The REM AHI was 0.0 events/hour and NREM AHI was 24.3 events/hour. The supine AHI was 40.0 events/hour and the non supine AHI was 20.4 events/hour. Respiratory disturbance index was 51 events/hour and was 5 events/hour in REM. Respiratory disturbances were associated with oxygen desaturation down to a nadir of 86.0% during sleep. The mean oxygen saturation during the study was 92.3%. The cumulative time under 88% oxygen saturation was 4.3 minutes.  LEG MOVEMENT DATA The total leg movements were 12 with a resulting leg movement index of 4.8/hr . Associated arousal with leg movement index was 0.0/hr.  CARDIAC DATA The underlying cardiac rhythm was most consistent with sinus rhythm. Mean heart rate during sleep was 59.9 bpm. Additional rhythm abnormalities include PVCs.  IMPRESSIONS - Moderate Obstructive Sleep Apnea (OSA) - Mild periodic leg movements (PLMs) during sleep. However, no significant associated arousals. - Poor sleep efficiency. He was awake much of the last half of the night (see hypnogram).   DIAGNOSIS - Obstructive Sleep Apnea (G47.33)  RECOMMENDATIONS - A SPLIT night study was ordered but patient's OSA was not severe enough early enough in the night to accomplish PAP titration. - Therapeutic CPAP titration or use of auto-adjusting CPAP to treat sleep disordered breathing.   Marius Ditch Sleep specialist,  [place] Board of Internal Medicine  ELECTRONICALLY SIGNED ON:  02/10/2021, 8:04 AM [place] PH: [phone]   FX: [phone] [place]

## 2021-06-20 ENCOUNTER — Encounter: Payer: Self-pay | Admitting: Pulmonary Disease

## 2021-06-21 ENCOUNTER — Other Ambulatory Visit: Payer: Self-pay | Admitting: Family Medicine

## 2021-06-24 ENCOUNTER — Other Ambulatory Visit: Payer: Self-pay | Admitting: Family Medicine

## 2021-06-24 DIAGNOSIS — R059 Cough, unspecified: Secondary | ICD-10-CM

## 2021-06-24 DIAGNOSIS — Z720 Tobacco use: Secondary | ICD-10-CM

## 2021-07-09 ENCOUNTER — Ambulatory Visit
Admission: RE | Admit: 2021-07-09 | Discharge: 2021-07-09 | Disposition: A | Discharge status: Home / Self Care | Payer: Medicare Other | Source: Ambulatory Visit | Attending: Family Medicine | Admitting: Family Medicine

## 2021-07-09 DIAGNOSIS — R059 Cough, unspecified: Secondary | ICD-10-CM

## 2021-07-09 DIAGNOSIS — Z720 Tobacco use: Secondary | ICD-10-CM

## 2021-07-09 MED ORDER — IOPAMIDOL (ISOVUE-300) INJECTION 61%
75.0000 mL | Freq: Once | INTRAVENOUS | Status: AC | PRN
  Administered 2021-07-09: 75 mL via INTRAVENOUS

## 2021-07-23 ENCOUNTER — Ambulatory Visit: Payer: Medicare Other | Admitting: Physician Assistant

## 2021-07-23 ENCOUNTER — Other Ambulatory Visit: Payer: Self-pay | Admitting: *Deleted

## 2021-07-23 DIAGNOSIS — I2510 Atherosclerotic heart disease of native coronary artery without angina pectoris: Secondary | ICD-10-CM

## 2021-07-23 DIAGNOSIS — E7800 Pure hypercholesterolemia, unspecified: Secondary | ICD-10-CM

## 2021-07-23 DIAGNOSIS — E785 Hyperlipidemia, unspecified: Secondary | ICD-10-CM

## 2021-07-23 MED ORDER — EVOLOCUMAB 140 MG/ML ~~LOC~~ SOAJ: 1.0000 mL | SUBCUTANEOUS | 3 refills | Status: DC

## 2021-07-23 NOTE — Telephone Encounter (Signed)
S/w pt to change appt due to [place] Being out sick.  Pt's new appt time and date Sept 21 @ 4:15. Pt states typically gets fasting lab work prior to appt.  Pt is willing to come in the am of appt to get fasting lab work.  Pt also needed repatha sent to mail order. Stated will route to [place] to see if pt needs to come in for fasting labs and will send pt a mychart message letting pt know.

## 2021-07-25 ENCOUNTER — Encounter: Payer: Self-pay | Admitting: Pulmonary Disease

## 2021-07-25 ENCOUNTER — Other Ambulatory Visit: Payer: Self-pay

## 2021-07-25 ENCOUNTER — Ambulatory Visit (INDEPENDENT_AMBULATORY_CARE_PROVIDER_SITE_OTHER): Payer: Medicare Other | Admitting: Pulmonary Disease

## 2021-07-25 VITALS — BP 124/56 | HR 63 | Temp 98.7°F | Ht 70.0 in | Wt 212.4 lb

## 2021-07-25 DIAGNOSIS — R059 Cough, unspecified: Secondary | ICD-10-CM

## 2021-07-25 MED ORDER — FLUTICASONE FUROATE-VILANTEROL 100-25 MCG/INH IN AEPB: 1.0000 | INHALATION_SPRAY | Freq: Every day | RESPIRATORY_TRACT | 0 refills | Status: DC

## 2021-07-25 MED ORDER — FLUTICASONE FUROATE-VILANTEROL 100-25 MCG/INH IN AEPB: 1.0000 | INHALATION_SPRAY | Freq: Every day | RESPIRATORY_TRACT | 3 refills | Status: DC

## 2021-07-25 NOTE — Patient Instructions (Addendum)
Nice to see you  Use Breo 1 puff daily - this will hopefully help with the cough  Send me a message in 4-6 weeks and let me know if the cough is better.  The CT scan and smoking make me feel like the cough is related to bronchitis and the inhaler will help  We may need to consider trying acid or reflux medicine as that can cause cough.  Return to clinic in 3 months or sooner as needed

## 2021-07-27 ENCOUNTER — Encounter: Payer: Self-pay | Admitting: Pulmonary Disease

## 2021-07-27 NOTE — Progress Notes (Signed)
$'@Patient'h$  ID: Kristopher Rowe, male    DOB: 15-Jul-1946, 75 y.o.   MRN: [id number]  Chief Complaint  Patient presents with   Consult    Cough with clear mucus    Referring provider: [name], MD  HPI:   75 y.o. whom we have seen in consultation for evaluation of chronic cough.  PCP note reviewed.  Cardiology note 12/2020 reviewed.  Notes cough on a daily basis for several years.  More often than not productive.  Sometimes dry.  No clear time of day when things are better or worse.  No seasonal environmental changes make things better or worse.  He denies atopic symptoms.  No position to make things better or worse.  No real alleviating or exacerbating factors.  He has a history of GERD.  No significant over 100-pack-year smoking history.  CT chest 07/09/2021 reviewed and interpreted as clear lungs bilaterally, thickened bronchioles throughout on my interpretation.  Recent sleep study 02/07/2021 reviewed with moderate obstructive sleep apnea.   PMH: CAD status post PCI, diabetes Surgical history:History reviewed. No pertinent surgical history. Family history: Denies significant respiratory illness with friends or relatives Social history: Former smoker, 100+ pack year, lives in [place], originally from [place] / Pulmonary Flowsheets:   ACT:  No flowsheet data found.  MMRC: No flowsheet data found.  Epworth:  No flowsheet data found.  Tests:   FENO:  No results found for: NITRICOXIDE  PFT: No flowsheet data found.  WALK:  No flowsheet data found.  Imaging: Personally reviewed and as per EMR and discussion of this note CT CHEST W CONTRAST  Result Date: 07/10/2021 CLINICAL DATA:  Intermittent productive cough.  Smoking history EXAM: CT CHEST WITH CONTRAST TECHNIQUE: Multidetector CT imaging of the chest was performed during intravenous contrast administration. CONTRAST:  45m ISOVUE-300 IOPAMIDOL (ISOVUE-300) INJECTION 61% COMPARISON:  None.  FINDINGS: Cardiovascular: Heart size is normal. No pericardial effusion. Thoracic aorta is nonaneurysmal. Atherosclerotic calcifications of the aorta and coronary arteries. Central pulmonary vasculature is within normal limits. Mediastinum/Nodes: No enlarged mediastinal, hilar, or axillary lymph nodes. Thyroid gland, trachea, and esophagus demonstrate no significant findings. Lungs/Pleura: Mild central bronchial wall thickening. Linear scarring or atelectasis within the lingula and right middle lobe. No suspicious pulmonary nodules or masses. No airspace consolidation. No pleural effusion or pneumothorax. Upper Abdomen: Diffuse hepatic steatosis. No acute findings are seen within the visualized upper abdomen. Musculoskeletal: Advanced degenerative disc disease is seen at the C6-7 level. Partially visualized lumbar fusion hardware. No acute bony findings. No chest wall abnormality. IMPRESSION: 1. Mild central bronchial wall thickening suggesting chronic bronchitis. Lungs are clear. 2. Diffuse hepatic steatosis. 3. Aortic atherosclerosis and coronary artery atherosclerosis (ICD10-I70.0). Electronically Signed   By: NDavina PokeD.O.   On: 07/10/2021 16:33    Lab Results:  CBC No results found for: WBC, RBC, HGB, HCT, PLT, MCV, MCH, MCHC, RDW, LYMPHSABS, MONOABS, EOSABS, BASOSABS  BMET No results found for: NA, K, CL, CO2, GLUCOSE, BUN, CREATININE, CALCIUM, GFRNONAA, GFRAA  BNP No results found for: BNP  ProBNP No results found for: PROBNP  Specialty Problems       Pulmonary Problems   Snoring   Witnessed episode of apnea    Allergies  Allergen Reactions   Penicillins      There is no immunization history on file for this patient.  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[facility]    ED (  erectile dysfunction)    Gout    Hypertension    Melanoma ([place])    OSA (obstructive sleep apnea)    PAD (peripheral  artery disease) (HCC)    Tobacco use     Tobacco History: Social History   Tobacco Use  Smoking Status Former   Packs/day: 2.00   Years: 50.00   Pack years: 100.00   Types: Cigarettes  Smokeless Tobacco Never   Counseling given: Not Answered   Continue to not smoke  Outpatient Encounter Medications as of 07/25/2021  Medication Sig   allopurinol (ZYLOPRIM) 300 MG tablet Take 300 mg by mouth daily.   aspirin EC 81 MG tablet Take 81 mg by mouth daily. Swallow whole.   atenolol (TENORMIN) 50 MG tablet Take 50 mg by mouth daily.   celecoxib (CELEBREX) 50 MG capsule Take 50 mg by mouth 2 (two) times daily.   cholecalciferol (VITAMIN D) 25 MCG (1000 UNIT) tablet Take 1,000 Units by mouth daily.   Evolocumab 140 MG/ML SOAJ Inject 1 mL into the skin every 14 (fourteen) days.   fenofibrate 160 MG tablet Take 160 mg by mouth daily.   fluticasone furoate-vilanterol (BREO ELLIPTA) 100-25 MCG/INH AEPB Inhale 1 puff into the lungs daily.   fluticasone furoate-vilanterol (BREO ELLIPTA) 100-25 MCG/INH AEPB Inhale 1 puff into the lungs daily.   hydrochlorothiazide (HYDRODIURIL) 25 MG tablet Take 1 tablet (25 mg total) by mouth daily.   metFORMIN (GLUCOPHAGE) 500 MG tablet Take by mouth 2 (two) times daily with a meal.   valsartan (DIOVAN) 160 MG tablet Take 160 mg by mouth daily.   varenicline (CHANTIX) 1 MG tablet Take 1 mg by mouth 2 (two) times daily.   No facility-administered encounter medications on file as of 07/25/2021.     Review of Systems  Review of Systems  No chest pain with exertion.  No orthopnea or PND.  Comprehensive review of systems otherwise negative. Physical Exam  BP (!) 124/56 (BP Location: Right Arm, Patient Position: Sitting, Cuff Size: Normal)   Pulse 63   Temp 98.7 F (37.1 C) (Oral)   Ht '5\' 10"'$  (1.778 m)   Wt 212 lb 6.4 oz (96.3 kg)   SpO2 96%   BMI 30.48 kg/m   Wt Readings from Last 5 Encounters:  07/25/21 212 lb 6.4 oz (96.3 kg)  02/07/21 205 lb (93  kg)  12/12/20 207 lb 9.6 oz (94.2 kg)    BMI Readings from Last 5 Encounters:  07/25/21 30.48 kg/m  02/07/21 28.59 kg/m  12/12/20 28.95 kg/m     Physical Exam General: Sitting in exam chair, no acute distress Eyes: EOMI, no icterus Neck: Supple, no JVP Pulmonary: Clear, no work of breathing Cardiovascular: Regular rate and rhythm, no murmur Abdomen: Nondistended, bowel sounds present MSK: No synovitis, no joint effusion Neuro: Normal gait, no weakness Psych: Normal mood, full affect  Assessment & Plan:   Chronic cough: Predominantly productive.  Near daily.  Present for over 2 years.  Clinically, chronic bronchitis.  Likely related to prior cigarette smoking.  Possible contribution of asthma given atopic symptoms.  CT scan consistent with bronchial wall thickening. --Trial Breo  GERD: Cough possibly related to reflux symptoms.  Consider trial of PPI if does not respond to ICS/LABA therapy.   Return in about 3 months (around 10/24/2021).   Lanier Clam, MD 07/27/2021

## 2021-07-31 ENCOUNTER — Other Ambulatory Visit: Payer: Self-pay

## 2021-07-31 ENCOUNTER — Other Ambulatory Visit: Payer: Medicare Other | Admitting: *Deleted

## 2021-07-31 ENCOUNTER — Ambulatory Visit (INDEPENDENT_AMBULATORY_CARE_PROVIDER_SITE_OTHER): Payer: Medicare Other | Admitting: Physician Assistant

## 2021-07-31 ENCOUNTER — Encounter: Payer: Self-pay | Admitting: Physician Assistant

## 2021-07-31 VITALS — BP 140/60 | HR 58 | Ht 70.0 in | Wt 214.8 lb

## 2021-07-31 DIAGNOSIS — E7800 Pure hypercholesterolemia, unspecified: Secondary | ICD-10-CM

## 2021-07-31 DIAGNOSIS — I739 Peripheral vascular disease, unspecified: Secondary | ICD-10-CM | POA: Diagnosis not present

## 2021-07-31 DIAGNOSIS — I700 Atherosclerosis of aorta: Secondary | ICD-10-CM

## 2021-07-31 DIAGNOSIS — I10 Essential (primary) hypertension: Secondary | ICD-10-CM | POA: Diagnosis not present

## 2021-07-31 DIAGNOSIS — E119 Type 2 diabetes mellitus without complications: Secondary | ICD-10-CM

## 2021-07-31 DIAGNOSIS — I2510 Atherosclerotic heart disease of native coronary artery without angina pectoris: Secondary | ICD-10-CM | POA: Diagnosis not present

## 2021-07-31 DIAGNOSIS — N1831 Chronic kidney disease, stage 3a: Secondary | ICD-10-CM

## 2021-07-31 LAB — COMPREHENSIVE METABOLIC PANEL
ALT: 38 IU/L (ref 0–44)
AST: 33 IU/L (ref 0–40)
Albumin/Globulin Ratio: 2.1 (ref 1.2–2.2)
Albumin: 4.5 g/dL (ref 3.7–4.7)
Alkaline Phosphatase: 48 IU/L (ref 44–121)
BUN/Creatinine Ratio: 16 (ref 10–24)
BUN: 23 mg/dL (ref 8–27)
Bilirubin Total: 0.4 mg/dL (ref 0.0–1.2)
CO2: 23 mmol/L (ref 20–29)
Calcium: 9.6 mg/dL (ref 8.6–10.2)
Chloride: 104 mmol/L (ref 96–106)
Creatinine, Ser: 1.4 mg/dL — ABNORMAL HIGH (ref 0.76–1.27)
Globulin, Total: 2.1 g/dL (ref 1.5–4.5)
Glucose: 167 mg/dL — ABNORMAL HIGH (ref 65–99)
Potassium: 4.5 mmol/L (ref 3.5–5.2)
Sodium: 143 mmol/L (ref 134–144)
Total Protein: 6.6 g/dL (ref 6.0–8.5)
eGFR: 52 mL/min/{1.73_m2} — ABNORMAL LOW (ref >59)

## 2021-07-31 LAB — LIPID PANEL
Chol/HDL Ratio: 4.4 ratio (ref 0.0–5.0)
Cholesterol, Total: 123 mg/dL (ref 100–199)
HDL: 28 mg/dL — ABNORMAL LOW (ref >39)
LDL Chol Calc (NIH): 40 mg/dL (ref 0–99)
Triglycerides: 372 mg/dL — ABNORMAL HIGH (ref 0–149)
VLDL Cholesterol Cal: 55 mg/dL — ABNORMAL HIGH (ref 5–40)

## 2021-07-31 NOTE — Progress Notes (Signed)
Cardiology Office Note:    Date:  07/31/2021   ID:  Kristopher Rowe, DOB [birth year], MRN [id number]  PCP:  [name], MD  [facility] Providers Cardiologist:  Freada Bergeron, MD Cardiology APP:  Sh[facility]on Revere     Referring MD: [name], MD   Chief Complaint:  F/u for CAD    Patient Profile:   Kristopher Rowe is a 75 y.o. male with:  Coronary artery disease  S/p stent to RCA, OM1 in 2012 Diabetes mellitus  Hypertension  Hyperlipidemia  Intol of statins>>PCSK9i Peripheral arterial disease  S/p stent to L CIA in 2016 Chronic kidney disease  OSA  Gout Ex-smoker Erectile dysfunction  Hepatic steatosis  Aortic atherosclerosis   Prior CV studies: ECHOCARDIOGRAM 01/24/21 EF 50-55, no RWMA, normal RVSF, normal PASP, trivial MR  History of Present Illness: Mr. Gales established with Dr. Johney Frame in 2/22.  He moved to [place] from [place].  He is originally from [place] and received prior CV care while living there.  Echocardiogram was obtained here and demonstrated EF 50-55.  He returns for f/u.  He is here alone.  He has been doing well.  He has not had chest discomfort, significant shortness of breath, orthopnea, syncope.  He has occasional mild pedal edema.        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[place])    ED (erectile dysfunction)    Gout    Hypertension    Melanoma ([place])    OSA (obstructive sleep apnea)    PAD (peripheral artery disease) (HCC)    Tobacco use    Current Medications: Current Meds  Medication Sig   allopurinol (ZYLOPRIM) 300 MG tablet Take 300 mg by mouth daily.   aspirin EC 81 MG tablet Take 81 mg by mouth daily. Swallow whole.   atenolol (TENORMIN) 50 MG tablet Take 50 mg by mouth daily.   celecoxib (CELEBREX) 50 MG capsule Take 50 mg by mouth 2 (two) times daily.   Evolocumab 140 MG/ML SOAJ Inject 1 mL into the skin every 14 (fourteen) days.    fenofibrate 160 MG tablet Take 160 mg by mouth daily.   fluticasone furoate-vilanterol (BREO ELLIPTA) 100-25 MCG/INH AEPB Inhale 1 puff into the lungs daily.   hydrochlorothiazide (HYDRODIURIL) 25 MG tablet Take 1 tablet (25 mg total) by mouth daily.   metFORMIN (GLUCOPHAGE) 500 MG tablet Take by mouth 2 (two) times daily with a meal.   valsartan (DIOVAN) 160 MG tablet Take 160 mg by mouth daily.   varenicline (CHANTIX) 1 MG tablet Take 1 mg by mouth 2 (two) times daily.    Allergies:   Penicillins   Social History   Tobacco Use   Smoking status: Former    Packs/day: 2.00    Years: 50.00    Pack years: 100.00    Types: Cigarettes   Smokeless tobacco: Never    Family Hx: The patient's family history includes Hodgkin's lymphoma in his father; Other in his mother.  Review of Systems  Cardiovascular:  Negative for claudication.  Respiratory:  Positive for cough (chronic).     EKGs/Labs/Other Test Reviewed:    EKG:  EKG is not ordered today.  The ekg ordered today demonstrates n/a  Recent Labs: 07/31/2021: ALT 38; BUN 23; Creatinine, Ser 1.40; Potassium 4.5; Sodium 143   Recent Lipid Panel Lab Results  Component Value Date/Time   CHOL 123 07/31/2021 10:03 AM  TRIG 372 (H) 07/31/2021 10:03 AM   HDL 28 (L) 07/31/2021 10:03 AM   LDLCALC 40 07/31/2021 10:03 AM    Labs obtained through [facility] - personally reviewed and interpreted: 06/19/2021: Total cholesterol 144, HDL 32, LDL 69, triglycerides 258, A1c 7.5, Hgb 15, creatinine 1.38, K+ 4.5, ALT 37   Risk Assessment/Calculations:          Physical Exam:    VS:  BP 140/60   Pulse (!) 58   Ht 5\' 10"  (1.778 m)   Wt 214 lb 12.8 oz (97.4 kg)   SpO2 96%   BMI 30.82 kg/m     Wt Readings from Last 3 Encounters:  07/31/21 214 lb 12.8 oz (97.4 kg)  07/25/21 212 lb 6.4 oz (96.3 kg)  02/07/21 205 lb (93 kg)    Constitutional:      Appearance: Healthy appearance. Not in distress.  Neck:     Vascular: No carotid bruit. JVD  normal.  Pulmonary:     Effort: Pulmonary effort is normal.     Breath sounds: No wheezing. No rales.  Cardiovascular:     Normal rate. Regular rhythm. Normal S1. Normal S2.      Murmurs: There is no murmur.  Pulses:    Intact distal pulses.  Edema:    Peripheral edema present.    Ankle: bilateral trace edema of the ankle. Abdominal:     Palpations: Abdomen is soft. There is no hepatomegaly.  Skin:    General: Skin is warm and dry.  Neurological:     General: No focal deficit present.     Mental Status: Alert and oriented to person, place and time.     Cranial Nerves: Cranial nerves are intact.       ASSESSMENT & PLAN:   1. Coronary artery disease involving native coronary artery of native heart without angina pectoris History of prior stenting to the RCA and OM1 in 2012.  He is doing well without anginal symptoms.  Echocardiogram in 3/22 demonstrated EF 50-55.  Continue aspirin 81 mg daily, evolocumab 140 mg every 14 days.  Follow-up in 6 months.  2. Essential hypertension Borderline control.  He notes better blood pressures at home.  I have encouraged him to maintain a low-salt diet and continue to increase activity.  Continue atenolol 50 mg daily, hydrochlorothiazide 25 mg daily, valsartan 160 mg daily.  3. Pure hypercholesterolemia Labs were obtained earlier today.  His LFTs are normal.  LDL is optimal.  Triglycerides remain elevated.  He does admit to eating high sugar load yesterday.  I have asked him to continue to adjust his diet to help lower his triglycerides.  We will repeat fasting CMET, lipids in 6 months.  Continue Evolocumab 140 mg every 14 days and fenofibrate 160 mg daily.  If triglycerides remain elevated, we could consider adding fish oil versus icosapent ethyl.  4. Peripheral arterial disease ([place]) History of stenting to the left common iliac artery in 2016.  He is doing well without claudication.  5. Type 2 diabetes mellitus without complication, without  long-term current use of insulin (HCC) Recent A1c 7.5.  Managed by primary care.  Empagliflozin could be considered given the results of the EMPA-REG OUTCOME trial.  SGLT2 inhibitor would also improve blood pressure somewhat.  6. Aortic atherosclerosis (HCC) Consider aspirin 81 mg daily, evolocumab 140 mg every 14 days.      7. Chronic kidney disease  Creatinine obtained earlier today stable.   Dispo:  Return in about  6 months (around 01/28/2022) for Routine Follow Up, w/ Dr. Johney Frame, or Richardson Dopp, PA-C.   Medication Adjustments/Labs and Tests Ordered: Current medicines are reviewed at length with the patient today.  Concerns regarding medicines are outlined above.  Tests Ordered: Orders Placed This Encounter  Procedures   Comprehensive metabolic panel   Lipid panel    Medication Changes: No orders of the defined types were placed in this encounter.  Signed, Richardson Dopp, PA-C  07/31/2021 5:12 PM    [facility] [address], [place], [place]  [address] Phone: [phone]; Fax: [phone]

## 2021-07-31 NOTE — Patient Instructions (Signed)
Medication Instructions:  Your physician recommends that you continue on your current medications as directed. Please refer to the Current Medication list given to you today.  *If you need a refill on your cardiac medications before your next appointment, please call your pharmacy*   Lab Work: CMET and fasting lipids one week before your six month follow up appointment  If you have labs (blood work) drawn today and your tests are completely normal, you will receive your results only by: [place] (if you have MyChart) OR A paper copy in the mail If you have any lab test that is abnormal or we need to change your treatment, we will call you to review the results.   Testing/Procedures: None   Follow-Up: At [facility], you and your health needs are our priority.  As part of our continuing mission to provide you with exceptional heart care, we have created designated Provider Care Teams.  These Care Teams include your primary Cardiologist (physician) and Advanced Practice Providers (APPs -  Physician Assistants and Nurse Practitioners) who all work together to provide you with the care you need, when you need it.    Your next appointment:   6 month(s)  The format for your next appointment:   In Person  Provider:   Gwyndolyn Kaufman, MD or Richardson Dopp, PA-C

## 2021-11-15 ENCOUNTER — Emergency Department (HOSPITAL_COMMUNITY)
Admission: EM | Admit: 2021-11-15 | Discharge: 2021-11-16 | Disposition: A | Discharge status: Home / Self Care | Payer: Medicare Other | Attending: Emergency Medicine | Admitting: Emergency Medicine

## 2021-11-15 ENCOUNTER — Other Ambulatory Visit: Payer: Self-pay

## 2021-11-15 ENCOUNTER — Encounter (HOSPITAL_COMMUNITY): Payer: Self-pay

## 2021-11-15 DIAGNOSIS — R109 Unspecified abdominal pain: Secondary | ICD-10-CM | POA: Diagnosis not present

## 2021-11-15 DIAGNOSIS — Z5321 Procedure and treatment not carried out due to patient leaving prior to being seen by health care provider: Secondary | ICD-10-CM | POA: Insufficient documentation

## 2021-11-15 DIAGNOSIS — Z7984 Long term (current) use of oral hypoglycemic drugs: Secondary | ICD-10-CM | POA: Diagnosis not present

## 2021-11-15 DIAGNOSIS — E119 Type 2 diabetes mellitus without complications: Secondary | ICD-10-CM | POA: Diagnosis not present

## 2021-11-15 DIAGNOSIS — R197 Diarrhea, unspecified: Secondary | ICD-10-CM | POA: Insufficient documentation

## 2021-11-15 DIAGNOSIS — Z7982 Long term (current) use of aspirin: Secondary | ICD-10-CM | POA: Diagnosis not present

## 2021-11-15 DIAGNOSIS — R112 Nausea with vomiting, unspecified: Secondary | ICD-10-CM | POA: Insufficient documentation

## 2021-11-15 DIAGNOSIS — R1110 Vomiting, unspecified: Secondary | ICD-10-CM | POA: Insufficient documentation

## 2021-11-15 LAB — COMPREHENSIVE METABOLIC PANEL
ALT: 29 U/L (ref 0–44)
AST: 30 U/L (ref 15–41)
Albumin: 4.1 g/dL (ref 3.5–5.0)
Alkaline Phosphatase: 44 U/L (ref 38–126)
Anion gap: 12 (ref 5–15)
BUN: 32 mg/dL — ABNORMAL HIGH (ref 8–23)
CO2: 22 mmol/L (ref 22–32)
Calcium: 9.5 mg/dL (ref 8.9–10.3)
Chloride: 102 mmol/L (ref 98–111)
Creatinine, Ser: 1.64 mg/dL — ABNORMAL HIGH (ref 0.61–1.24)
GFR, Estimated: 43 mL/min — ABNORMAL LOW (ref >60)
Glucose, Bld: 186 mg/dL — ABNORMAL HIGH (ref 70–99)
Potassium: 4.2 mmol/L (ref 3.5–5.1)
Sodium: 136 mmol/L (ref 135–145)
Total Bilirubin: 0.9 mg/dL (ref 0.3–1.2)
Total Protein: 7 g/dL (ref 6.5–8.1)

## 2021-11-15 LAB — LIPASE, BLOOD: Lipase: 46 U/L (ref 11–51)

## 2021-11-15 LAB — CBC WITH DIFFERENTIAL/PLATELET
Abs Immature Granulocytes: 0.15 10*3/uL — ABNORMAL HIGH (ref 0.00–0.07)
Basophils Absolute: 0 10*3/uL (ref 0.0–0.1)
Basophils Relative: 0 %
Eosinophils Absolute: 0.1 10*3/uL (ref 0.0–0.5)
Eosinophils Relative: 1 %
HCT: 49 % (ref 39.0–52.0)
Hemoglobin: 16.7 g/dL (ref 13.0–17.0)
Immature Granulocytes: 1 %
Lymphocytes Relative: 18 %
Lymphs Abs: 2.1 10*3/uL (ref 0.7–4.0)
MCH: 32.5 pg (ref 26.0–34.0)
MCHC: 34.1 g/dL (ref 30.0–36.0)
MCV: 95.3 fL (ref 80.0–100.0)
Monocytes Absolute: 0.6 10*3/uL (ref 0.1–1.0)
Monocytes Relative: 6 %
Neutro Abs: 8.6 10*3/uL — ABNORMAL HIGH (ref 1.7–7.7)
Neutrophils Relative %: 74 %
Platelets: 384 10*3/uL (ref 150–400)
RBC: 5.14 MIL/uL (ref 4.22–5.81)
RDW: 13 % (ref 11.5–15.5)
WBC: 11.6 10*3/uL — ABNORMAL HIGH (ref 4.0–10.5)
nRBC: 0 % (ref 0.0–0.2)

## 2021-11-15 MED ORDER — ONDANSETRON 4 MG PO TBDP
4.0000 mg | ORAL_TABLET | Freq: Once | ORAL | Status: AC
  Administered 2021-11-15: 4 mg via ORAL

## 2021-11-15 MED ORDER — ONDANSETRON HCL 4 MG/2ML IJ SOLN: 4.0000 mg | Freq: Once | INTRAMUSCULAR | Status: DC

## 2021-11-15 NOTE — ED Provider Triage Note (Signed)
Emergency Medicine Provider Triage Evaluation Note  Kristopher Rowe , a 76 y.o. male  was evaluated in triage.  Pt complains of possible food poisoning.  States he woke up this morning and felt fine, ate a [employer] whopper around nude and then had egg noodles with beef this evening.  States whopper did seem a little "off".  Vomiting and diarrhea began about 1 hour ago with a lot of diffuse abdominal cramping.  Review of Systems  Positive: Nausea, vomiting, diarrhea Negative: Fever  Physical Exam  BP (!) 166/85 (BP Location: Right Arm)    Pulse 76    Temp 98.4 F (36.9 C)    Resp 18    Ht 5\' 10"  (1.778 m)    Wt 97 kg    SpO2 96%    BMI 30.68 kg/m   Gen:   Awake, seems nauseated, somewhat pale Resp:  Normal effort  MSK:   Moves extremities without difficulty  Other:    Medical Decision Making  Medically screening exam initiated at 10:42 PM.  Appropriate orders placed.  Jabez Molner was informed that the remainder of the evaluation will be completed by another provider, this initial triage assessment does not replace that evaluation, and the importance of remaining in the ED until their evaluation is complete.  N/V/D.  Concerned for possible food poisoning.  Asymptomatic prior to eating takeout.  Will check labs, given zofran in triage.   Larene Pickett, PA-C 11/15/21 2246

## 2021-11-15 NOTE — ED Triage Notes (Signed)
Pt arrives POV for eval of abd pain/cramping/diarrhea onset 1 hour PTA. Pt reports he thinks it may have been food poisoning from some food earlier today.

## 2021-11-16 ENCOUNTER — Encounter (HOSPITAL_COMMUNITY): Payer: Self-pay | Admitting: *Deleted

## 2021-11-16 ENCOUNTER — Emergency Department (HOSPITAL_COMMUNITY)
Admission: EM | Admit: 2021-11-16 | Discharge: 2021-11-16 | Disposition: A | Discharge status: Home / Self Care | Payer: Medicare Other | Source: Home / Self Care | Attending: Emergency Medicine | Admitting: Emergency Medicine

## 2021-11-16 ENCOUNTER — Other Ambulatory Visit: Payer: Self-pay

## 2021-11-16 DIAGNOSIS — Z7984 Long term (current) use of oral hypoglycemic drugs: Secondary | ICD-10-CM | POA: Insufficient documentation

## 2021-11-16 DIAGNOSIS — R112 Nausea with vomiting, unspecified: Secondary | ICD-10-CM

## 2021-11-16 DIAGNOSIS — Z7982 Long term (current) use of aspirin: Secondary | ICD-10-CM | POA: Insufficient documentation

## 2021-11-16 DIAGNOSIS — E119 Type 2 diabetes mellitus without complications: Secondary | ICD-10-CM | POA: Insufficient documentation

## 2021-11-16 DIAGNOSIS — R109 Unspecified abdominal pain: Secondary | ICD-10-CM

## 2021-11-16 DIAGNOSIS — R197 Diarrhea, unspecified: Secondary | ICD-10-CM | POA: Insufficient documentation

## 2021-11-16 MED ORDER — ONDANSETRON 4 MG PO TBDP: 4.0000 mg | ORAL_TABLET | Freq: Three times a day (TID) | ORAL | 0 refills | Status: DC | PRN

## 2021-11-16 MED ORDER — ONDANSETRON 4 MG PO TBDP
4.0000 mg | ORAL_TABLET | Freq: Once | ORAL | Status: AC | PRN
  Administered 2021-11-16: 4 mg via ORAL

## 2021-11-16 NOTE — ED Triage Notes (Signed)
The pt was here earlier with what he thought was food poisoning from eatin food at 1300 and 1800  nausea and vomiting started at 2200  he lefdt and went home to feed his dog and came back  still feeling unwell

## 2021-11-16 NOTE — ED Provider Notes (Signed)
[facility] EMERGENCY DEPARTMENT Provider Note   CSN: [id number] Arrival date & time: 11/16/21  0247     History  Chief Complaint  Patient presents with   Emesis    Kristopher Rowe is a 76 y.o. male presenting for evaluation of n/v/d, abd cramping.   Pt states he ate a whopper yesterday morning, and states it tasted off. Several hours later he developed nausea, vomiting, diarrhea, and abdominal cramping.  No blood in his emesis or stool.  He did not take anything for his symptoms.  He came to the ER, had lab work and was given Zofran.  Symptoms completely resolved with Zofran, so he went home prior to provider evaluation to take care of his dogs.  After being home for several hours however, his symptoms returned, prompting him to come back to the ER.  He once again was given Zofran in triage, and he is now symptom-free.  He denies fever, chest pain, shortness of breath, cough, urinary symptoms.  He does have a history of diabetes, is on metformin and Jardiance.  No previous history of stomach problems.  HPI     Home Medications Prior to Admission medications   Medication Sig Start Date End Date Taking? Authorizing Provider  ondansetron (ZOFRAN-ODT) 4 MG disintegrating tablet Take 1 tablet (4 mg total) by mouth every 8 (eight) hours as needed for nausea or vomiting. 11/16/21  Yes Olivier Frayre, PA-C  allopurinol (ZYLOPRIM) 300 MG tablet Take 300 mg by mouth daily.    [provider]  aspirin EC 81 MG tablet Take 81 mg by mouth daily. Swallow whole.    [provider]  atenolol (TENORMIN) 50 MG tablet Take 50 mg by mouth daily.    [provider]  celecoxib (CELEBREX) 50 MG capsule Take 50 mg by mouth 2 (two) times daily.    [provider]  cholecalciferol (VITAMIN D) 25 MCG (1000 UNIT) tablet Take 1,000 Units by mouth daily. Patient not taking: Reported on 07/31/2021    [provider]  Evolocumab 140 MG/ML SOAJ Inject 1 mL  into the skin every 14 (fourteen) days. 07/23/21   Freada Bergeron, MD  fenofibrate 160 MG tablet Take 160 mg by mouth daily.    [provider]  fluticasone furoate-vilanterol (BREO ELLIPTA) 100-25 MCG/INH AEPB Inhale 1 puff into the lungs daily. 07/25/21   Hunsucker, Bonna Gains, MD  fluticasone furoate-vilanterol (BREO ELLIPTA) 100-25 MCG/INH AEPB Inhale 1 puff into the lungs daily. Patient not taking: Reported on 07/31/2021 07/25/21   Hunsucker, Bonna Gains, MD  hydrochlorothiazide (HYDRODIURIL) 25 MG tablet Take 1 tablet (25 mg total) by mouth daily. 12/12/20   Freada Bergeron, MD  metFORMIN (GLUCOPHAGE) 500 MG tablet Take by mouth 2 (two) times daily with a meal.    [provider]  valsartan (DIOVAN) 160 MG tablet Take 160 mg by mouth daily.    [provider]  varenicline (CHANTIX) 1 MG tablet Take 1 mg by mouth 2 (two) times daily.    [provider]      Allergies    Penicillins    Review of Systems   Review of Systems  Gastrointestinal:  Positive for abdominal pain, diarrhea, nausea and vomiting.  All other systems reviewed and are negative.  Physical Exam Updated Vital Signs BP 126/73    Pulse (!) 59    Temp 99.3 F (37.4 C)    Resp 20    Ht 5\' 10"  (1.778 m)  Wt 97 kg    SpO2 96%    BMI 30.68 kg/m  Physical Exam Vitals and nursing note reviewed.  Constitutional:      General: He is not in acute distress.    Appearance: Normal appearance.  HENT:     Head: Normocephalic and atraumatic.  Eyes:     Conjunctiva/sclera: Conjunctivae normal.     Pupils: Pupils are equal, round, and reactive to light.  Cardiovascular:     Rate and Rhythm: Normal rate and regular rhythm.     Pulses: Normal pulses.  Pulmonary:     Effort: Pulmonary effort is normal. No respiratory distress.     Breath sounds: Normal breath sounds. No wheezing.     Comments: Speaking in full sentences.  Clear lung sounds in all fields. Abdominal:     General: There is  no distension.     Palpations: Abdomen is soft. There is no mass.     Tenderness: There is no abdominal tenderness. There is no guarding or rebound.     Comments: No tenderness to palpation the abdomen.  No rigidity, guarding, distention.  Negative rebound.  No peritonitis.  Musculoskeletal:        General: Normal range of motion.     Cervical back: Normal range of motion and neck supple.  Skin:    General: Skin is warm and dry.     Capillary Refill: Capillary refill takes less than 2 seconds.  Neurological:     Mental Status: He is alert and oriented to person, place, and time.  Psychiatric:        Mood and Affect: Mood and affect normal.        Speech: Speech normal.        Behavior: Behavior normal.    ED Results / Procedures / Treatments   Labs (all labs ordered are listed, but only abnormal results are displayed) Labs Reviewed - No data to display  EKG None  Radiology No results found.  Procedures Procedures    Medications Ordered in ED Medications  ondansetron (ZOFRAN-ODT) disintegrating tablet 4 mg (4 mg Oral Given 11/16/21 4268)    ED Course/ Medical Decision Making/ A&P                           Medical Decision Making   This patient presents to the ED for concern of n/v/d, abd cramping. This involves a number of treatment options, and is a complaint that carries with it a moderate risk of complications and morbidity.  The differential diagnosis includes viral GI illness, foodborne illness, gastritis, gastroparesis, diverticulitis.   Co morbidities: Diabetes   Additional history and lab tests: I reviewed patient's initial triage to the ER yesterday, during which she had blood work done.  I reviewed labs which showed slight worsening kidney function, but overall reassuring.  Mild leukocytosis of 11, likely reactive.   Medicines ordered:  Patient received Zofran in triage which completely resolved his symptoms.  I offered IV fluids for dehydration,  patient declined.  Test Considered:  Discussed option of CT scan with patient.  Discussed that due to his age and symptoms, there could be something else going on such as diverticulitis.  However as patient reports his symptoms have completely resolved, and his exam is reassuring, patient does not want to have a CT scan at this time.  I am agreeable with this in the setting of no tenderness on palpation of the abdomen and symptom  resolution with Zofran.   Dispostion:  After consideration of the diagnostic results and the patients response to treatment, I feel that the patent would benefit from symptomatic outpatient management and close PCP follow-up.  Discussed with patient likely viral GI illness versus foodborne illness.  Discussed use of Zofran for nausea and vomiting, use of Tylenol and ibuprofen as needed for pain.  Discussed bland diet and importance of hydration.  At this time, patient appears safe for discharge.  Return precautions given.  Patient states he understands and agrees plan   Final Clinical Impression(s) / ED Diagnoses Final diagnoses:  Nausea vomiting and diarrhea  Abdominal cramping    Rx / DC Orders ED Discharge Orders          Ordered    ondansetron (ZOFRAN-ODT) 4 MG disintegrating tablet  Every 8 hours PRN        11/16/21 0722              Franchot Heidelberg, PA-C 11/16/21 [place], [place], MD 11/16/21 [phone]

## 2021-11-16 NOTE — Discharge Instructions (Signed)
Continue taking home medications as prescribed. You Zofran as needed for nausea or vomiting. Use Tylenol or ibuprofen as needed for fever, abdominal pain, or cramping. Make sure staying well-hydrated water.  Your urine should be clear to pale yellow. Start with a bland diet, and gradually progress your diet as your symptoms improve. Follow-up with your primary care doctor next week for recheck of your symptoms. Return to the emergency room if you develop high fevers, persistent vomiting despite medication, severe worsening abdominal pain, blood in your vomit or stool, or any new, worsening, or concerning symptoms

## 2021-11-16 NOTE — ED Notes (Signed)
Pt was called for vitals, no answer.

## 2021-11-16 NOTE — ED Notes (Signed)
X2 no answer

## 2021-12-19 ENCOUNTER — Other Ambulatory Visit: Payer: Self-pay | Admitting: Pulmonary Disease

## 2022-01-22 ENCOUNTER — Other Ambulatory Visit: Payer: Medicare Other | Admitting: *Deleted

## 2022-01-22 ENCOUNTER — Other Ambulatory Visit: Payer: Self-pay

## 2022-01-22 DIAGNOSIS — I2510 Atherosclerotic heart disease of native coronary artery without angina pectoris: Secondary | ICD-10-CM

## 2022-01-22 DIAGNOSIS — I700 Atherosclerosis of aorta: Secondary | ICD-10-CM

## 2022-01-22 DIAGNOSIS — I10 Essential (primary) hypertension: Secondary | ICD-10-CM

## 2022-01-22 DIAGNOSIS — E7800 Pure hypercholesterolemia, unspecified: Secondary | ICD-10-CM

## 2022-01-22 DIAGNOSIS — I739 Peripheral vascular disease, unspecified: Secondary | ICD-10-CM

## 2022-01-22 LAB — COMPREHENSIVE METABOLIC PANEL
ALT: 36 IU/L (ref 0–44)
AST: 34 IU/L (ref 0–40)
Albumin/Globulin Ratio: 2.3 — ABNORMAL HIGH (ref 1.2–2.2)
Albumin: 5 g/dL — ABNORMAL HIGH (ref 3.7–4.7)
Alkaline Phosphatase: 54 IU/L (ref 44–121)
BUN/Creatinine Ratio: 14 (ref 10–24)
BUN: 25 mg/dL (ref 8–27)
Bilirubin Total: 0.5 mg/dL (ref 0.0–1.2)
CO2: 24 mmol/L (ref 20–29)
Calcium: 11.3 mg/dL — ABNORMAL HIGH (ref 8.6–10.2)
Chloride: 101 mmol/L (ref 96–106)
Creatinine, Ser: 1.75 mg/dL — ABNORMAL HIGH (ref 0.76–1.27)
Globulin, Total: 2.2 g/dL (ref 1.5–4.5)
Glucose: 157 mg/dL — ABNORMAL HIGH (ref 70–99)
Potassium: 4.9 mmol/L (ref 3.5–5.2)
Sodium: 141 mmol/L (ref 134–144)
Total Protein: 7.2 g/dL (ref 6.0–8.5)
eGFR: 40 mL/min/{1.73_m2} — ABNORMAL LOW (ref >59)

## 2022-01-22 LAB — LIPID PANEL
Chol/HDL Ratio: 4.6 ratio (ref 0.0–5.0)
Cholesterol, Total: 153 mg/dL (ref 100–199)
HDL: 33 mg/dL — ABNORMAL LOW (ref >39)
LDL Chol Calc (NIH): 69 mg/dL (ref 0–99)
Triglycerides: 320 mg/dL — ABNORMAL HIGH (ref 0–149)
VLDL Cholesterol Cal: 51 mg/dL — ABNORMAL HIGH (ref 5–40)

## 2022-01-24 ENCOUNTER — Telehealth: Payer: Self-pay

## 2022-01-24 DIAGNOSIS — Z79899 Other long term (current) drug therapy: Secondary | ICD-10-CM

## 2022-01-24 DIAGNOSIS — E7800 Pure hypercholesterolemia, unspecified: Secondary | ICD-10-CM

## 2022-01-24 MED ORDER — HYDROCHLOROTHIAZIDE 12.5 MG PO CAPS: 12.5000 mg | ORAL_CAPSULE | Freq: Every day | ORAL | 0 refills | Status: DC

## 2022-01-24 MED ORDER — ICOSAPENT ETHYL 1 G PO CAPS: 2.0000 g | ORAL_CAPSULE | Freq: Two times a day (BID) | ORAL | 3 refills | Status: DC

## 2022-01-24 MED ORDER — ICOSAPENT ETHYL 1 G PO CAPS: 2.0000 g | ORAL_CAPSULE | Freq: Two times a day (BID) | ORAL | 0 refills | Status: DC

## 2022-01-24 MED ORDER — HYDROCHLOROTHIAZIDE 12.5 MG PO CAPS: 12.5000 mg | ORAL_CAPSULE | Freq: Every day | ORAL | 3 refills | Status: DC

## 2022-01-24 NOTE — Telephone Encounter (Signed)
-----   Message from Liliane Shi, PA-C sent at 01/23/2022  8:33 AM EDT ----- ?Creatinine increased.  Calcium high.  K+ normal.  Trigs high.  LDL at goal.   ?PLAN:  ?-Rec caution with Celebrex, NSAIDs - regular use can contribute to worsening kidney function ?-Decrease HCTZ to 12.5 mg once daily  ?-Monitor BP and send readings in 2 weeks >> we can consider adding Norvasc if uncontrolled ?-Start Vascepa 2 grams twice daily  ?-BMET 2 weeks ?-Fasting Lipids, LFTs 3 mos ?-Pt needs to see PCP soon for elevated calcium.  Send copy to PCP and arrange f/u. ?Richardson Dopp, PA-C    ?01/23/2022 8:25 AM   ?

## 2022-01-24 NOTE — Telephone Encounter (Signed)
Called pt to review recommendations of [facility] post-labs. Pt states he does not use Celebrex, but he does use Indomethacin '25mg'$  every other day. Pt knows this can be hard on his kidneys and says he only uses as needed when he feels a gout flare-up coming on, but after reviewing his meds, admits that he has been taking '25mg'$  QOD "for a while" since he has began playing pickle ball and has been having bad low back pain. Pt states he will discontinue use entirely until he sees Dr Johney Frame on 01/28/22, but does state that he realizes due to these labs, that he can't continue use as he has been. Pt also states that his PCP increased his Metformin to '1000mg'$  BID which can be hard on his kidneys as well. Rectified all meds on file, because several were not listed or dosages we had were incorrect.  ?Pt requested that HCTZ and Vascepa rx's be sent to mail order, but would like 1 month sent to [facility] so that he can get started on them right away. ? ?Pt wishes to not do BMET until after he sees Dr Johney Frame because he is discontinuing use of Indomethacin and would like her to advise if it's still needed or if it can be done same day as appt. ? ?Informed pt of need to do repeat lipids and LFT's and he states that he would like to schedule at his OV since he is not currently near his calendar and will forget-order has been placed, lab just needs to be scheduled. ? ?Will send lab results to PCP hagler to follow up on hypercalcemia. ? ?

## 2022-01-26 NOTE — Telephone Encounter (Signed)
Ok.  He can use Acetaminophen (Tylenol) as needed for pain.  It is safe for his kidneys.  Max dose for Tylenol it 4000 mg total 1 day taken in divided doses (ie - Tylenol 1000 mg every 6 hours). ?Richardson Dopp, PA-C    ?01/26/2022 9:53 PM   ?

## 2022-01-26 NOTE — Progress Notes (Deleted)
?Cardiology Office Note:   ? ?Date:  01/26/2022  ? ?ID:  Kristopher Rowe, DOB 22-Mar-1946, MRN [id number] ? ?PCP:  [name], MD  ?[facility] Cardiologist:  Freada Bergeron, MD  ?[facility] Electrophysiologist:  None  ? ?Referring MD: [name], MD  ? ? ?History of Present Illness:   ? ?Kristopher Rowe is a 76 y.o. male with a hx of CAD s/p remote history of PCI to RCA and OM1, DMII, HTN, PAD s/p PTA and stent of left common iliac (2016), prior tobacco abuse, OSA, and gout who presents to clinic for follow-up. ? ?Patient moved to [place] from [place] and established care in 12/2020 . Has extensive coronary history with history of RCA stent and OM1 in 2012. Also has left common iliac stenting in 2016 for severe claudication. Was doing well without anginal symptoms.  ? ?TTE with EF 50-55, no RWMA, normal RVSF, normal PASP, trivial MR.  ? ?Last seen by Richardson Dopp 07/31/21 where he was doing well.  ? ? ?Past Medical History:  ?Diagnosis Date  ? CAD (coronary artery disease)   ? DM (diabetes mellitus) with complications ([place])   ? ED (erectile dysfunction)   ? Gout   ? Hypertension   ? Melanoma ([place])   ? OSA (obstructive sleep apnea)   ? PAD (peripheral artery disease) ([place])   ? Tobacco use   ? ? ?No past surgical history on file. ? ?Current Medications: ?No outpatient medications have been marked as taking for the 01/28/22 encounter (Appointment) with Freada Bergeron, MD.  ?  ? ?Allergies:   Penicillins  ? ?Social History  ? ?Socioeconomic History  ? Marital status: Divorced  ?  Spouse name: Not on file  ? Number of children: Not on file  ? Years of education: Not on file  ? Highest education level: Not on file  ?Occupational History  ? Not on file  ?Tobacco Use  ? Smoking status: Former  ?  Packs/day: 2.00  ?  Years: 50.00  ?  Pack years: 100.00  ?  Types: Cigarettes  ? Smokeless tobacco: Never  ?Substance and Sexual Activity  ? Alcohol use: Not on file  ? Drug use: Not on file  ? Sexual activity:  Not on file  ?Other Topics Concern  ? Not on file  ?Social History Narrative  ? Not on file  ? ?Social Determinants of Health  ? ?Financial Resource Strain: Not on file  ?Food Insecurity: Not on file  ?Transportation Needs: Not on file  ?Physical Activity: Not on file  ?Stress: Not on file  ?Social Connections: Not on file  ?  ? ?Family History: ?The patient's family history includes Hodgkin's lymphoma in his father; Other in his mother. ? ?ROS:   ?Please see the history of present illness.    ?Review of Systems  ?Constitutional:  Negative for chills and fever.  ?HENT:  Negative for nosebleeds.   ?Eyes:  Negative for blurred vision.  ?Respiratory:  Negative for shortness of breath.   ?Cardiovascular:  Negative for chest pain, palpitations, orthopnea, claudication, leg swelling and PND.  ?Gastrointestinal:  Negative for melena, nausea and vomiting.  ?Genitourinary:  Negative for hematuria.  ?Musculoskeletal:  Positive for joint pain.  ?Neurological:  Negative for dizziness and loss of consciousness.  ?Psychiatric/Behavioral:  Negative for substance abuse.   ? ?EKGs/Labs/Other Studies Reviewed:   ? ?The following studies were reviewed today: ?Patient brought records today which were reviewed. ? ?2016: Left iliac artery stenting ?1999:  RCA stent ?2012: OM1 stent ?2009: Myoview with inferior infarct but no ischemia ? ?EKG:  EKG is  ordered today.  The ekg ordered today demonstrates sinus bradycardia with HR 57; inferior q-waves ? ?Recent Labs: ?11/15/2021: Hemoglobin 16.7; Platelets 384 ?01/22/2022: ALT 36; BUN 25; Creatinine, Ser 1.75; Potassium 4.9; Sodium 141  ?Recent Lipid Panel ?   ?Component Value Date/Time  ? CHOL 153 01/22/2022 0802  ? TRIG 320 (H) 01/22/2022 0802  ? HDL 33 (L) 01/22/2022 0802  ? CHOLHDL 4.6 01/22/2022 0802  ? [place] 69 01/22/2022 0802  ? ? ? ? ?Physical Exam:   ? ?VS:  There were no vitals taken for this visit.   ? ?Wt Readings from Last 3 Encounters:  ?11/16/21 213 lb 13.5 oz (97 kg)  ?11/15/21  213 lb 13.5 oz (97 kg)  ?07/31/21 214 lb 12.8 oz (97.4 kg)  ?  ? ?GEN:  Well nourished, well developed in no acute distress ?HEENT: Normal ?NECK: No JVD; No carotid bruits ?CARDIAC: RRR, 1/6 systolic murmur. No rubs, gallops ?RESPIRATORY:  Clear to auscultation without rales, wheezing or rhonchi  ?ABDOMEN: Soft, non-tender, non-distended ?MUSCULOSKELETAL:  No edema; No deformity  ?SKIN: Warm and dry ?NEUROLOGIC:  Alert and oriented x 3 ?PSYCHIATRIC:  Normal affect  ? ?ASSESSMENT:   ? ?No diagnosis found. ? ?PLAN:   ? ?In order of problems listed above: ? ?#Multivessel CAD s/p remote history of PCI to RCA and OM1: ?Previously followed in [place] with history of PCI to RCA and OM1. TTE with LVEF 50-55%. No current anginal symptoms. ?-Continue ASA '81mg'$  daily ?-Continue repatha '140mg'$  q 2weeks ?-Continue atenolol '50mg'$  daily ?-Continue Valsartan '160mg'$  daily ?-Continue vascepa 2g BID ?-Continue fenofibrate '160mg'$  daily ? ?#PAD s/p stenting to left iliac artery: ?Doing well without claudication symptoms. ?-Continue ASA '81mg'$  daily ?-Continue vascepa 2g BID ?-Continue fenofibrate '160mg'$  daily ?-Continue repatha '140mg'$  q 2 weeks ?-Continue healthy lifestyle with diet and exercise ? ?#HTN:. ?-Continue HCTZ '25mg'$  daily ?-Continue valsartan '160mg'$  daily ?-Continue atenolol '50mg'$  daily ?-Discussed importance of low Na diet ? ?#HLD: ?-Continue vascepa 2g BID ?-Continue fenofibrate '160mg'$  daily ?-Continue repatha '140mg'$  q 2 weeks ? ?#DMII: ?-Continue metformin ? ?#Tobacco Abuse: ?-Continue varenicline ? ? ?Medication Adjustments/Labs and Tests Ordered: ?Current medicines are reviewed at length with the patient today.  Concerns regarding medicines are outlined above.  ?No orders of the defined types were placed in this encounter. ? ?No orders of the defined types were placed in this encounter. ? ? ?There are no Patient Instructions on file for this visit. ?  ? ?Signed, ?Freada Bergeron, MD  ?01/26/2022 4:36 PM    ?[place] ?

## 2022-01-28 ENCOUNTER — Ambulatory Visit (INDEPENDENT_AMBULATORY_CARE_PROVIDER_SITE_OTHER): Payer: Medicare Other | Admitting: Cardiology

## 2022-01-28 ENCOUNTER — Other Ambulatory Visit: Payer: Self-pay

## 2022-01-28 ENCOUNTER — Encounter: Payer: Self-pay | Admitting: Cardiology

## 2022-01-28 VITALS — BP 136/82 | HR 56 | Ht 70.0 in | Wt 208.2 lb

## 2022-01-28 DIAGNOSIS — E7800 Pure hypercholesterolemia, unspecified: Secondary | ICD-10-CM | POA: Diagnosis not present

## 2022-01-28 DIAGNOSIS — I10 Essential (primary) hypertension: Secondary | ICD-10-CM

## 2022-01-28 DIAGNOSIS — Z79899 Other long term (current) drug therapy: Secondary | ICD-10-CM | POA: Diagnosis not present

## 2022-01-28 DIAGNOSIS — E119 Type 2 diabetes mellitus without complications: Secondary | ICD-10-CM | POA: Diagnosis not present

## 2022-01-28 DIAGNOSIS — N1831 Chronic kidney disease, stage 3a: Secondary | ICD-10-CM

## 2022-01-28 DIAGNOSIS — I2510 Atherosclerotic heart disease of native coronary artery without angina pectoris: Secondary | ICD-10-CM

## 2022-01-28 DIAGNOSIS — I739 Peripheral vascular disease, unspecified: Secondary | ICD-10-CM

## 2022-01-28 NOTE — Patient Instructions (Signed)
Medication Instructions:  ? ?Your physician recommends that you continue on your current medications as directed. Please refer to the Current Medication list given to you today. ? ?*If you need a refill on your cardiac medications before your next appointment, please call your pharmacy* ? ? ? ?Follow-Up: ?At [facility], you and your health needs are our priority.  As part of our continuing mission to provide you with exceptional heart care, we have created designated Provider Care Teams.  These Care Teams include your primary Cardiologist (physician) and Advanced Practice Providers (APPs -  Physician Assistants and Nurse Practitioners) who all work together to provide you with the care you need, when you need it. ? ?We recommend signing up for the patient portal called "MyChart".  Sign up information is provided on this After Visit Summary.  MyChart is used to connect with patients for Virtual Visits (Telemedicine).  Patients are able to view lab/test results, encounter notes, upcoming appointments, etc.  Non-urgent messages can be sent to your provider as well.   ?To learn more about what you can do with MyChart, go to [email or web address].   ? ?Your next appointment:   ?6 month(s) ? ?The format for your next appointment:   ?In Person ? ?Provider:   ?Freada Bergeron, MD { ? ? ? ?

## 2022-01-28 NOTE — Progress Notes (Signed)
?Cardiology Office Note:   ? ?Date:  01/28/2022  ? ?ID:  Kristopher Rowe, DOB 05-18-1946, MRN [id number] ? ?PCP:  [name], MD  ?[facility] Cardiologist:  Freada Bergeron, MD  ?[facility] Electrophysiologist:  None  ? ?Referring MD: [name], MD  ? ? ?History of Present Illness:   ? ?Kristopher Rowe is a 76 y.o. male with a hx of CAD s/p remote history of PCI to RCA and OM1, DMII, HTN, PAD s/p PTA and stent of left common iliac (2016), prior tobacco abuse, OSA, and gout who presents to clinic for follow-up. ? ?Patient moved to [place] from [place] and established care in 12/2020 . Has extensive coronary history with history of RCA stent and OM1 in 2012. Also has left common iliac stenting in 2016 for severe claudication. Was doing well without anginal symptoms.  ? ?TTE with EF 50-55, no RWMA, normal RVSF, normal PASP, trivial MR.  ? ?Last seen by Richardson Dopp 07/31/21 where he was doing well.  ? ?Today, he is doing well with no major complaints. He reports occasionally waking up with swelling in his hands but no swelling in his lower extremities. He denies chest pain, chest pressure, dyspnea at rest or with exertion, palpitations, PND, or orthopnea. ? ?He does not regularly record his blood pressure. His last known blood pressure was 562 systolic. He is compliant with all medications. No bleeding issues. ? ?Past Medical History:  ?Diagnosis Date  ? CAD (coronary artery disease)   ? DM (diabetes mellitus) with complications ([place])   ? ED (erectile dysfunction)   ? Gout   ? Hypertension   ? Melanoma ([place])   ? OSA (obstructive sleep apnea)   ? PAD (peripheral artery disease) ([place])   ? Tobacco use   ? ? ?No past surgical history on file. ? ?Current Medications: ?Current Meds  ?Medication Sig  ? ACCU-CHEK GUIDE test strip AS DIRECTED IN VITRO ONCE A DAY 90 DAYS  ? Accu-Chek Softclix Lancets lancets See admin instructions.  ? allopurinol (ZYLOPRIM) 300 MG tablet Take 300 mg by mouth daily.  ? aspirin EC  81 MG tablet Take 81 mg by mouth daily. Swallow whole.  ? atenolol (TENORMIN) 50 MG tablet Take 50 mg by mouth daily.  ? BREO ELLIPTA 100-25 MCG/ACT AEPB TAKE 1 PUFF BY MOUTH EVERY DAY  ? cholecalciferol (VITAMIN D) 25 MCG (1000 UNIT) tablet Take 1,000 Units by mouth daily.  ? Evolocumab 140 MG/ML SOAJ Inject 1 mL into the skin every 14 (fourteen) days.  ? fenofibrate 160 MG tablet Take 160 mg by mouth daily.  ? fluticasone (FLONASE) 50 MCG/ACT nasal spray Place into both nostrils.  ? hydrochlorothiazide (MICROZIDE) 12.5 MG capsule Take 1 capsule (12.5 mg total) by mouth daily.  ? hydrochlorothiazide (MICROZIDE) 12.5 MG capsule Take 1 capsule (12.5 mg total) by mouth daily.  ? icosapent Ethyl (VASCEPA) 1 g capsule Take 2 capsules (2 g total) by mouth 2 (two) times daily.  ? icosapent Ethyl (VASCEPA) 1 g capsule Take 2 capsules (2 g total) by mouth 2 (two) times daily.  ? indomethacin (INDOCIN) 25 MG capsule Take 25 mg by mouth 2 (two) times daily. Take 1 capsule twice daily with food or milk for 14 days  ? JARDIANCE 10 MG TABS tablet Take 10 mg by mouth daily.  ? metFORMIN (GLUCOPHAGE) 1000 MG tablet Take 1,000 mg by mouth 2 (two) times daily.  ? valsartan (DIOVAN) 160 MG tablet Take 160 mg by mouth daily.  ?  varenicline (CHANTIX) 1 MG tablet Take 1 mg by mouth daily.  ?  ? ?Allergies:   Penicillins  ? ?Social History  ? ?Socioeconomic History  ? Marital status: Divorced  ?  Spouse name: Not on file  ? Number of children: Not on file  ? Years of education: Not on file  ? Highest education level: Not on file  ?Occupational History  ? Not on file  ?Tobacco Use  ? Smoking status: Former  ?  Packs/day: 2.00  ?  Years: 50.00  ?  Pack years: 100.00  ?  Types: Cigarettes  ? Smokeless tobacco: Never  ?Substance and Sexual Activity  ? Alcohol use: Not on file  ? Drug use: Not on file  ? Sexual activity: Not on file  ?Other Topics Concern  ? Not on file  ?Social History Narrative  ? Not on file  ? ?Social Determinants of  Health  ? ?Financial Resource Strain: Not on file  ?Food Insecurity: Not on file  ?Transportation Needs: Not on file  ?Physical Activity: Not on file  ?Stress: Not on file  ?Social Connections: Not on file  ?  ? ?Family History: ?The patient's family history includes Hodgkin's lymphoma in his father; Other in his mother. ? ?ROS:   ?Please see the history of present illness.    ?Review of Systems  ?Constitutional:  Negative for chills and fever.  ?HENT:  Negative for congestion and nosebleeds.   ?Eyes:  Negative for blurred vision.  ?Respiratory:  Negative for shortness of breath.   ?Cardiovascular:  Negative for chest pain, palpitations, orthopnea, claudication, leg swelling and PND.  ?Gastrointestinal:  Negative for melena, nausea and vomiting.  ?Genitourinary:  Negative for hematuria.  ?Musculoskeletal:  Negative for joint pain and myalgias.  ?     Positive for bilateral hand swelling  ?Skin:  Negative for itching and rash.  ?Neurological:  Negative for dizziness and headaches.  ?Endo/Heme/Allergies:  Does not bruise/bleed easily.  ?Psychiatric/Behavioral:  The patient is not nervous/anxious and does not have insomnia.   ? ?EKGs/Labs/Other Studies Reviewed:   ? ?The following studies were reviewed today: ?Chest CT 07/09/21 ?IMPRESSION: ?1. Mild central bronchial wall thickening suggesting chronic ?bronchitis. Lungs are clear. ?2. Diffuse hepatic steatosis. ?3. Aortic atherosclerosis and coronary artery atherosclerosis ?(ICD10-I70.0).  ? ?Echo 01/24/2021 ?1. Left ventricular ejection fraction, by estimation, is 50 to 55%. The left ventricle has low normal function. The left ventricle has no regional wall motion abnormalities. Left ventricular diastolic parameters are indeterminate.  ? 2. Right ventricular systolic function is normal. The right ventricular size is normal. There is normal pulmonary artery systolic pressure.  ? 3. The mitral valve is grossly normal. Trivial mitral valve  ?regurgitation.  ? 4. The aortic  valve is tricuspid. There is mild calcification of the aortic valve. There is mild thickening of the aortic valve. Aortic valve regurgitation is not visualized.  ? ?Patient brought records today which were reviewed. ?2016: Left iliac artery stenting ?2012: OM1 stent ?2009: Myoview with inferior infarct but no ischemia ?1999: RCA stent ? ?EKG:  ?01/28/21: Sinus bradycardia, rate 56 bpm ?12/12/20: sinus bradycardia with HR 57; inferior q-waves ? ?Recent Labs: ?11/15/2021: Hemoglobin 16.7; Platelets 384 ?01/22/2022: ALT 36; BUN 25; Creatinine, Ser 1.75; Potassium 4.9; Sodium 141  ?Recent Lipid Panel ?   ?Component Value Date/Time  ? CHOL 153 01/22/2022 0802  ? TRIG 320 (H) 01/22/2022 0802  ? HDL 33 (L) 01/22/2022 0802  ? CHOLHDL 4.6 01/22/2022 0802  ? [place]  69 01/22/2022 0802  ? ? ? ? ?Physical Exam:   ? ?VS:  BP 136/82   Pulse (!) 56   Ht '5\' 10"'$  (1.778 m)   Wt 208 lb 3.2 oz (94.4 kg)   SpO2 96%   BMI 29.87 kg/m?    ? ?Wt Readings from Last 3 Encounters:  ?01/28/22 208 lb 3.2 oz (94.4 kg)  ?11/16/21 213 lb 13.5 oz (97 kg)  ?11/15/21 213 lb 13.5 oz (97 kg)  ?  ? ?GEN:  Well nourished, well developed in no acute distress ?HEENT: Normal ?NECK: No JVD; No carotid bruits ?CARDIAC: Bradycardic Regular, 1/6 systolic murmur. No rubs, gallops ?RESPIRATORY:  Clear to auscultation without rales, wheezing or rhonchi  ?ABDOMEN: Soft, non-tender, non-distended ?MUSCULOSKELETAL:  No edema; No deformity  ?SKIN: Warm and dry ?NEUROLOGIC:  Alert and oriented x 3 ?PSYCHIATRIC:  Normal affect  ? ?ASSESSMENT:   ? ?1. Medication management   ?2. Pure hypercholesterolemia   ?3. Coronary artery disease involving native coronary artery of native heart without angina pectoris   ? ? ?PLAN:   ? ?In order of problems listed above: ? ?#Multivessel CAD s/p remote history of PCI to RCA and OM1: ?Previously followed in [place] with history of PCI to RCA and OM1. TTE with LVEF 50-55%. No current anginal symptoms. ?-Continue ASA '81mg'$   daily ?-Continue repatha '140mg'$  q 2weeks ?-Continue atenolol '50mg'$  daily ?-Continue Valsartan '160mg'$  daily ?-Continue vascepa 2g BID ?-Continue fenofibrate '160mg'$  daily ? ?#PAD s/p stenting to left iliac artery: ?Doing well wi

## 2022-02-04 ENCOUNTER — Ambulatory Visit (INDEPENDENT_AMBULATORY_CARE_PROVIDER_SITE_OTHER): Payer: Medicare Other | Admitting: Cardiology

## 2022-02-04 ENCOUNTER — Telehealth: Payer: Self-pay | Admitting: Cardiology

## 2022-02-04 ENCOUNTER — Encounter: Payer: Self-pay | Admitting: Cardiology

## 2022-02-04 ENCOUNTER — Other Ambulatory Visit: Payer: Self-pay

## 2022-02-04 VITALS — BP 140/80 | HR 59 | Ht 70.0 in | Wt 209.8 lb

## 2022-02-04 DIAGNOSIS — M79604 Pain in right leg: Secondary | ICD-10-CM | POA: Diagnosis not present

## 2022-02-04 DIAGNOSIS — I10 Essential (primary) hypertension: Secondary | ICD-10-CM | POA: Diagnosis not present

## 2022-02-04 DIAGNOSIS — I700 Atherosclerosis of aorta: Secondary | ICD-10-CM

## 2022-02-04 DIAGNOSIS — E7800 Pure hypercholesterolemia, unspecified: Secondary | ICD-10-CM

## 2022-02-04 DIAGNOSIS — I739 Peripheral vascular disease, unspecified: Secondary | ICD-10-CM | POA: Diagnosis not present

## 2022-02-04 DIAGNOSIS — E119 Type 2 diabetes mellitus without complications: Secondary | ICD-10-CM

## 2022-02-04 DIAGNOSIS — I2510 Atherosclerotic heart disease of native coronary artery without angina pectoris: Secondary | ICD-10-CM

## 2022-02-04 NOTE — Telephone Encounter (Signed)
Patient  states he has been having extreme leg pain in his left quad. He says the last time he experienced this it was in his other leg and he ended up needing a stent in the leg. He says he can make the pain go away if he lays down and pulls his heel towards his rear. He states the pain was not as bad but after his last appointment 3/21 it got much worse and is pretty much constant now. He says the pain is 7 or 7.5 out of 10. He says it is really beginning to impact his life and he is very concerned.  ? ?

## 2022-02-04 NOTE — Telephone Encounter (Signed)
Pt saw Dr. Johney Frame in clinic today for complaints.  ? ?He is scheduled for ABI's and LE Arterial US for 02/06/22, for further evaluation.  See OV noted from today for further details.  ?

## 2022-02-04 NOTE — Telephone Encounter (Signed)
Spoke with the Kristopher Rowe.  He is complaining of extreme left leg pain to his quad area. ?Kristopher Rowe states he does have a history of having stents in his other leg.  ?He is concerned this is now occurring in his left leg and would like to be evaluated.  He denies redness or warmth to the area, but painful when he walks on it. He denies any sob or chest pain at this time. ?Scheduled the Kristopher Rowe to come in and see Dr. Johney Frame today 3/28 at 4:30 pm.  He is aware to arrive 15 mins prior to this appt.  ?Kristopher Rowe verbalized understanding and agrees with this plan.  ?Kristopher Rowe was more than gracious for all the assistance provided. ?Will send this message to Dr. Johney Frame as an Juluis Rainier.  ?

## 2022-02-04 NOTE — Progress Notes (Deleted)
?Cardiology Office Note:   ? ?Date:  02/04/2022  ? ?ID:  Kristopher Rowe, DOB 06-Dec-1945, MRN [id number] ? ?PCP:  [name], MD  ?[facility] Cardiologist:  Freada Bergeron, MD  ?[facility] Electrophysiologist:  None  ? ?Referring MD: [name], MD  ? ? ?History of Present Illness:   ? ?Kristopher Rowe is a 76 y.o. male with a hx of CAD s/p remote history of PCI to RCA and OM1, DMII, HTN, PAD s/p PTA and stent of left common iliac (2016), prior tobacco abuse, OSA, and gout who presents to clinic for follow-up. ? ?Patient moved to [place] from [place] and established care in 12/2020 . Has extensive coronary history with history of RCA stent and OM1 in 2012. Also has left common iliac stenting in 2016 for severe claudication. ? ?TTE with EF 50-55, no RWMA, normal RVSF, normal PASP, trivial MR.  ? ?Was seen on 01/28/22 where he was doing well with no anginal or claudication symptoms.  ? ?He called clinic on 02/04/22 due to significant leg pain in his quad area. He is concerned it may represent his claudication and therefore he is returning to clinic today for further evaluation. ? ?Today, *** ? ?Past Medical History:  ?Diagnosis Date  ? CAD (coronary artery disease)   ? DM (diabetes mellitus) with complications ([place])   ? ED (erectile dysfunction)   ? Gout   ? Hypertension   ? Melanoma ([place])   ? OSA (obstructive sleep apnea)   ? PAD (peripheral artery disease) ([place])   ? Tobacco use   ? ? ?No past surgical history on file. ? ?Current Medications: ?No outpatient medications have been marked as taking for the 02/04/22 encounter (Appointment) with Freada Bergeron, MD.  ?  ? ?Allergies:   Penicillins  ? ?Social History  ? ?Socioeconomic History  ? Marital status: Divorced  ?  Spouse name: Not on file  ? Number of children: Not on file  ? Years of education: Not on file  ? Highest education level: Not on file  ?Occupational History  ? Not on file  ?Tobacco Use  ? Smoking status: Former  ?  Packs/day: 2.00   ?  Years: 50.00  ?  Pack years: 100.00  ?  Types: Cigarettes  ? Smokeless tobacco: Never  ?Substance and Sexual Activity  ? Alcohol use: Not on file  ? Drug use: Not on file  ? Sexual activity: Not on file  ?Other Topics Concern  ? Not on file  ?Social History Narrative  ? Not on file  ? ?Social Determinants of Health  ? ?Financial Resource Strain: Not on file  ?Food Insecurity: Not on file  ?Transportation Needs: Not on file  ?Physical Activity: Not on file  ?Stress: Not on file  ?Social Connections: Not on file  ?  ? ?Family History: ?The patient's family history includes Hodgkin's lymphoma in his father; Other in his mother. ? ?ROS:   ?Please see the history of present illness.    ?Review of Systems  ?Constitutional:  Negative for chills and fever.  ?HENT:  Negative for congestion and nosebleeds.   ?Eyes:  Negative for blurred vision.  ?Respiratory:  Negative for shortness of breath.   ?Cardiovascular:  Negative for chest pain, palpitations, orthopnea, claudication, leg swelling and PND.  ?Gastrointestinal:  Negative for melena, nausea and vomiting.  ?Genitourinary:  Negative for hematuria.  ?Musculoskeletal:  Negative for joint pain and myalgias.  ?     Positive for bilateral  hand swelling  ?Skin:  Negative for itching and rash.  ?Neurological:  Negative for dizziness and headaches.  ?Endo/Heme/Allergies:  Does not bruise/bleed easily.  ?Psychiatric/Behavioral:  The patient is not nervous/anxious and does not have insomnia.   ? ?EKGs/Labs/Other Studies Reviewed:   ? ?The following studies were reviewed today: ?Chest CT 07/09/21 ?IMPRESSION: ?1. Mild central bronchial wall thickening suggesting chronic ?bronchitis. Lungs are clear. ?2. Diffuse hepatic steatosis. ?3. Aortic atherosclerosis and coronary artery atherosclerosis ?(ICD10-I70.0).  ? ?Echo 01/24/2021 ?1. Left ventricular ejection fraction, by estimation, is 50 to 55%. The left ventricle has low normal function. The left ventricle has no regional wall  motion abnormalities. Left ventricular diastolic parameters are indeterminate.  ? 2. Right ventricular systolic function is normal. The right ventricular size is normal. There is normal pulmonary artery systolic pressure.  ? 3. The mitral valve is grossly normal. Trivial mitral valve  ?regurgitation.  ? 4. The aortic valve is tricuspid. There is mild calcification of the aortic valve. There is mild thickening of the aortic valve. Aortic valve regurgitation is not visualized.  ? ?Patient brought records today which were reviewed. ?2016: Left iliac artery stenting ?2012: OM1 stent ?2009: Myoview with inferior infarct but no ischemia ?1999: RCA stent ? ?EKG:  ?01/28/21: Sinus bradycardia, rate 56 bpm ?12/12/20: sinus bradycardia with HR 57; inferior q-waves ? ?Recent Labs: ?11/15/2021: Hemoglobin 16.7; Platelets 384 ?01/22/2022: ALT 36; BUN 25; Creatinine, Ser 1.75; Potassium 4.9; Sodium 141  ?Recent Lipid Panel ?   ?Component Value Date/Time  ? CHOL 153 01/22/2022 0802  ? TRIG 320 (H) 01/22/2022 0802  ? HDL 33 (L) 01/22/2022 0802  ? CHOLHDL 4.6 01/22/2022 0802  ? [place] 69 01/22/2022 0802  ? ? ? ? ?Physical Exam:   ? ?VS:  There were no vitals taken for this visit.   ? ?Wt Readings from Last 3 Encounters:  ?01/28/22 208 lb 3.2 oz (94.4 kg)  ?11/16/21 213 lb 13.5 oz (97 kg)  ?11/15/21 213 lb 13.5 oz (97 kg)  ?  ? ?GEN:  Well nourished, well developed in no acute distress ?HEENT: Normal ?NECK: No JVD; No carotid bruits ?CARDIAC: Bradycardic Regular, 1/6 systolic murmur. No rubs, gallops ?RESPIRATORY:  Clear to auscultation without rales, wheezing or rhonchi  ?ABDOMEN: Soft, non-tender, non-distended ?MUSCULOSKELETAL:  No edema; No deformity  ?SKIN: Warm and dry ?NEUROLOGIC:  Alert and oriented x 3 ?PSYCHIATRIC:  Normal affect  ? ?ASSESSMENT:   ? ?No diagnosis found. ? ? ?PLAN:   ? ?In order of problems listed above: ? ?#Multivessel CAD s/p remote history of PCI to RCA and OM1: ?Previously followed in [place] with history  of PCI to RCA and OM1. TTE with LVEF 50-55%. No current anginal symptoms. ?-Continue ASA '81mg'$  daily ?-Continue repatha '140mg'$  q 2weeks ?-Continue atenolol '50mg'$  daily ?-Continue Valsartan '160mg'$  daily ?-Continue vascepa 2g BID ?-Continue fenofibrate '160mg'$  daily ? ?#PAD s/p stenting to left iliac artery: ?Reporting worsening quad pain with concern for recurrent claudication. ?-Check lower extremity arterial dopplers ?-Continue ASA '81mg'$  daily ?-Continue vascepa 2g BID ?-Continue fenofibrate '160mg'$  daily ?-Continue repatha '140mg'$  q 2 weeks ?-Continue healthy lifestyle with diet and exercise ? ?#HTN:. ?Controlled.  ?-Continue HCTZ '25mg'$  daily ?-Continue valsartan '160mg'$  daily ?-Continue atenolol '50mg'$  daily ?-Discussed importance of low Na diet ?-Monitor blood pressures at home ? ?#HLD: ?-Continue vascepa 2g BID ?-Continue fenofibrate '160mg'$  daily ?-Continue repatha '140mg'$  q 2 weeks ?-LDL 69 ? ?#DMII: ?-Continue metformin ? ?#Tobacco Abuse: ?-Continue varenicline ? ? ?Medication Adjustments/Labs and Tests Ordered: ?  Current medicines are reviewed at length with the patient today.  Concerns regarding medicines are outlined above.  ?No orders of the defined types were placed in this encounter. ? ?No orders of the defined types were placed in this encounter. ? ? ?There are no Patient Instructions on file for this visit. ? ? ?I,Mykaella Javier,acting as a scribe for Freada Bergeron, MD.,have documented all relevant documentation on the behalf of Freada Bergeron, MD,as directed by  Freada Bergeron, MD while in the presence of Freada Bergeron, MD. ? ?I, Freada Bergeron, MD, have reviewed all documentation for this visit. The documentation on 02/04/22 for the exam, diagnosis, procedures, and orders are all accurate and complete.  ? ?Signed, ?Freada Bergeron, MD  ?02/04/2022 1:09 PM    ?[place] ?

## 2022-02-04 NOTE — Patient Instructions (Signed)
Medication Instructions:  ? ?Your physician recommends that you continue on your current medications as directed. Please refer to the Current Medication list given to you today. ? ?*If you need a refill on your cardiac medications before your next appointment, please call your pharmacy* ? ? ?Testing/Procedures: ? ?Your physician has requested that you have a lower extremity arterial duplex. This test is an ultrasound of the arteries in the legs or arms. It looks at arterial blood flow in the legs and arms. Allow one hour for Lower and Upper Arterial scans. There are no restrictions or special instructions  DO ABI's SAME TIME AS THIS TEST ? ?Your physician has requested that you have an ankle brachial index (ABI). During this test an ultrasound and blood pressure cuff are used to evaluate the arteries that supply the arms and legs with blood. Allow thirty minutes for this exam. There are no restrictions or special instructions.  DO LE ARTERIAL US SAME TIME AS THIS TEST ? ? ? ?Follow-Up: ? ?KEEP 6 MONTH FOLLOW-UP APPOINTMENT AS SCHEDULED WITH DR. Johney Frame ON 07/25/22 ? ? ?

## 2022-02-04 NOTE — Progress Notes (Signed)
?Cardiology Office Note:   ? ?Date:  02/04/2022  ? ?ID:  Kristopher Rowe, DOB [birth year], MRN [id number] ? ?PCP:  [name], MD  ?[facility] Cardiologist:  Freada Bergeron, MD  ?[facility] Electrophysiologist:  None  ? ?Referring MD: [name], MD  ? ? ?History of Present Illness:   ? ?Kristopher Rowe is a 76 y.o. male with a hx of CAD s/p remote history of PCI to RCA and OM1, DMII, HTN, PAD s/p PTA and stent of left common iliac (2016), prior tobacco abuse, OSA, and gout who presents to clinic for follow-up. ? ?Patient moved to [place] from [place] and established care in 12/2020 . Has extensive coronary history with history of RCA stent and OM1 in 2012. Also has left common iliac stenting in 2016 for severe claudication. ? ?TTE with EF 50-55, no RWMA, normal RVSF, normal PASP, trivial MR.  ? ?Was seen on 01/28/22 where he was doing well with no anginal or claudication symptoms.  ? ?He called the clinic on 02/04/22 due to significant leg pain in his quad area. He is concerned it may represent his claudication and therefore he is returning to clinic today for further evaluation. ? ?Today, the patient states that his symptoms have been worsening and are significantly affecting his life. He complains of intermittent, exertional pain in his right thigh that feels similar to the pain he felt prior to his LE stent placement in 2016. He notes the pain feels deep and is worse with exertion.  He describes the pain as "a solid ache, not sharp." No recent injuries and pain is not worsened with palpation. ? ?He denies any palpitations, chest pain, shortness of breath, or peripheral edema. No lightheadedness, headaches, syncope, orthopnea, or PND. ? ? ?Past Medical History:  ?Diagnosis Date  ? CAD (coronary artery disease)   ? DM (diabetes mellitus) with complications ([place])   ? ED (erectile dysfunction)   ? Gout   ? Hypertension   ? Melanoma ([place])   ? OSA (obstructive sleep apnea)   ? PAD (peripheral artery  disease) ([place])   ? Tobacco use   ? ? ?No past surgical history on file. ? ?Current Medications: ?Current Meds  ?Medication Sig  ? ACCU-CHEK GUIDE test strip AS DIRECTED IN VITRO ONCE A DAY 90 DAYS  ? Accu-Chek Softclix Lancets lancets See admin instructions.  ? allopurinol (ZYLOPRIM) 300 MG tablet Take 300 mg by mouth daily.  ? aspirin EC 81 MG tablet Take 81 mg by mouth daily. Swallow whole.  ? atenolol (TENORMIN) 50 MG tablet Take 50 mg by mouth daily.  ? BREO ELLIPTA 100-25 MCG/ACT AEPB TAKE 1 PUFF BY MOUTH EVERY DAY  ? cholecalciferol (VITAMIN D) 25 MCG (1000 UNIT) tablet Take 1,000 Units by mouth daily.  ? Evolocumab 140 MG/ML SOAJ Inject 1 mL into the skin every 14 (fourteen) days.  ? fenofibrate 160 MG tablet Take 160 mg by mouth daily.  ? fluticasone (FLONASE) 50 MCG/ACT nasal spray Place into both nostrils.  ? hydrochlorothiazide (MICROZIDE) 12.5 MG capsule Take 1 capsule (12.5 mg total) by mouth daily.  ? hydrochlorothiazide (MICROZIDE) 12.5 MG capsule Take 1 capsule (12.5 mg total) by mouth daily.  ? icosapent Ethyl (VASCEPA) 1 g capsule Take 2 capsules (2 g total) by mouth 2 (two) times daily.  ? icosapent Ethyl (VASCEPA) 1 g capsule Take 2 capsules (2 g total) by mouth 2 (two) times daily.  ? indomethacin (INDOCIN) 25 MG capsule Take 25 mg by  mouth 2 (two) times daily. Take 1 capsule twice daily with food or milk for 14 days  ? JARDIANCE 10 MG TABS tablet Take 10 mg by mouth daily.  ? metFORMIN (GLUCOPHAGE) 1000 MG tablet Take 1,000 mg by mouth 2 (two) times daily.  ? valsartan (DIOVAN) 160 MG tablet Take 160 mg by mouth daily.  ? varenicline (CHANTIX) 1 MG tablet Take 1 mg by mouth daily.  ?  ? ?Allergies:   Penicillins  ? ?Social History  ? ?Socioeconomic History  ? Marital status: Divorced  ?  Spouse name: Not on file  ? Number of children: Not on file  ? Years of education: Not on file  ? Highest education level: Not on file  ?Occupational History  ? Not on file  ?Tobacco Use  ? Smoking status:  Former  ?  Packs/day: 2.00  ?  Years: 50.00  ?  Pack years: 100.00  ?  Types: Cigarettes  ? Smokeless tobacco: Never  ?Substance and Sexual Activity  ? Alcohol use: Not on file  ? Drug use: Not on file  ? Sexual activity: Not on file  ?Other Topics Concern  ? Not on file  ?Social History Narrative  ? Not on file  ? ?Social Determinants of Health  ? ?Financial Resource Strain: Not on file  ?Food Insecurity: Not on file  ?Transportation Needs: Not on file  ?Physical Activity: Not on file  ?Stress: Not on file  ?Social Connections: Not on file  ?  ? ?Family History: ?The patient's family history includes Hodgkin's lymphoma in his father; Other in his mother. ? ?ROS:   ?Please see the history of present illness.    ?Review of Systems  ?Constitutional:  Negative for chills and fever.  ?HENT:  Negative for congestion and nosebleeds.   ?Eyes:  Negative for blurred vision.  ?Respiratory:  Negative for shortness of breath.   ?Cardiovascular:  Positive for claudication. Negative for chest pain, palpitations, orthopnea, leg swelling and PND.  ?Gastrointestinal:  Negative for melena, nausea and vomiting.  ?Genitourinary:  Negative for hematuria.  ?Musculoskeletal:  Positive for joint pain and myalgias.  ?Skin:  Negative for itching and rash.  ?Neurological:  Negative for dizziness and headaches.  ?Endo/Heme/Allergies:  Does not bruise/bleed easily.  ?Psychiatric/Behavioral:  The patient is not nervous/anxious and does not have insomnia.   ? ?EKGs/Labs/Other Studies Reviewed:   ? ?The following studies were reviewed today: ? ?Chest CT 07/09/21 ?IMPRESSION: ?1. Mild central bronchial wall thickening suggesting chronic ?bronchitis. Lungs are clear. ?2. Diffuse hepatic steatosis. ?3. Aortic atherosclerosis and coronary artery atherosclerosis ?(ICD10-I70.0).  ? ?Echo 01/24/2021 ?1. Left ventricular ejection fraction, by estimation, is 50 to 55%. The left ventricle has low normal function. The left ventricle has no regional wall motion  abnormalities. Left ventricular diastolic parameters are indeterminate.  ? 2. Right ventricular systolic function is normal. The right ventricular size is normal. There is normal pulmonary artery systolic pressure.  ? 3. The mitral valve is grossly normal. Trivial mitral valve  ?regurgitation.  ? 4. The aortic valve is tricuspid. There is mild calcification of the aortic valve. There is mild thickening of the aortic valve. Aortic valve regurgitation is not visualized.  ? ?Patient brought records today which were reviewed. ?2016: Left iliac artery stenting ?2012: OM1 stent ?2009: Myoview with inferior infarct but no ischemia ?1999: RCA stent ? ?EKG:    EKG is personally reviewed. ?02/04/2022: EKG was not ordered. ?01/28/22: Sinus bradycardia, rate 56 bpm ?12/12/20: sinus bradycardia  with HR 57; inferior q-waves ? ?Recent Labs: ?11/15/2021: Hemoglobin 16.7; Platelets 384 ?01/22/2022: ALT 36; BUN 25; Creatinine, Ser 1.75; Potassium 4.9; Sodium 141  ? ?Recent Lipid Panel ?   ?Component Value Date/Time  ? CHOL 153 01/22/2022 0802  ? TRIG 320 (H) 01/22/2022 0802  ? HDL 33 (L) 01/22/2022 0802  ? CHOLHDL 4.6 01/22/2022 0802  ? [place] 69 01/22/2022 0802  ? ? ? ? ?Physical Exam:   ? ?VS:  BP 140/80   Pulse (!) 59   Ht '5\' 10"'$  (1.778 m)   Wt 209 lb 12.8 oz (95.2 kg)   SpO2 97%   BMI 30.10 kg/m?    ? ?Wt Readings from Last 3 Encounters:  ?02/04/22 209 lb 12.8 oz (95.2 kg)  ?01/28/22 208 lb 3.2 oz (94.4 kg)  ?11/16/21 213 lb 13.5 oz (97 kg)  ?  ? ?GEN:  Well nourished, well developed in no acute distress ?HEENT: Normal ?NECK: No JVD; No carotid bruits ?CARDIAC: Bradycardic, regular, 1/6 systolic murmur. No rubs, gallops. 2+ DP pulses bilaterally. Could not palpate PT.  ?RESPIRATORY:  Clear to auscultation without rales, wheezing or rhonchi  ?ABDOMEN: Soft, non-tender, non-distended ?MUSCULOSKELETAL:  No edema; No deformity  ?SKIN: Warm and dry ?NEUROLOGIC:  Alert and oriented x 3 ?PSYCHIATRIC:  Normal affect  ? ? ?ASSESSMENT:    ? ?1. Claudication [facility])   ?2. Right leg pain   ?3. Peripheral arterial disease ([place])   ?4. Coronary artery disease involving native coronary artery of native heart without angina pectoris   ?5. Essential hyperte

## 2022-02-06 ENCOUNTER — Ambulatory Visit (HOSPITAL_COMMUNITY)
Admission: RE | Admit: 2022-02-06 | Discharge: 2022-02-06 | Disposition: A | Discharge status: Home / Self Care | Payer: Medicare Other | Source: Ambulatory Visit | Attending: Cardiology | Admitting: Cardiology

## 2022-02-06 DIAGNOSIS — I739 Peripheral vascular disease, unspecified: Secondary | ICD-10-CM | POA: Insufficient documentation

## 2022-02-06 DIAGNOSIS — M79604 Pain in right leg: Secondary | ICD-10-CM | POA: Diagnosis present

## 2022-02-10 ENCOUNTER — Ambulatory Visit (INDEPENDENT_AMBULATORY_CARE_PROVIDER_SITE_OTHER): Payer: Medicare Other | Admitting: Sports Medicine

## 2022-02-10 ENCOUNTER — Ambulatory Visit (INDEPENDENT_AMBULATORY_CARE_PROVIDER_SITE_OTHER): Payer: Medicare Other

## 2022-02-10 VITALS — BP 130/78 | HR 65 | Ht 70.0 in | Wt 209.0 lb

## 2022-02-10 DIAGNOSIS — Z96641 Presence of right artificial hip joint: Secondary | ICD-10-CM

## 2022-02-10 DIAGNOSIS — I10 Essential (primary) hypertension: Secondary | ICD-10-CM | POA: Insufficient documentation

## 2022-02-10 DIAGNOSIS — M545 Low back pain, unspecified: Secondary | ICD-10-CM

## 2022-02-10 DIAGNOSIS — I739 Peripheral vascular disease, unspecified: Secondary | ICD-10-CM | POA: Insufficient documentation

## 2022-02-10 DIAGNOSIS — J302 Other seasonal allergic rhinitis: Secondary | ICD-10-CM | POA: Insufficient documentation

## 2022-02-10 DIAGNOSIS — F17201 Nicotine dependence, unspecified, in remission: Secondary | ICD-10-CM | POA: Insufficient documentation

## 2022-02-10 DIAGNOSIS — E118 Type 2 diabetes mellitus with unspecified complications: Secondary | ICD-10-CM | POA: Insufficient documentation

## 2022-02-10 DIAGNOSIS — E669 Obesity, unspecified: Secondary | ICD-10-CM | POA: Insufficient documentation

## 2022-02-10 DIAGNOSIS — M25551 Pain in right hip: Secondary | ICD-10-CM

## 2022-02-10 DIAGNOSIS — M1A00X0 Idiopathic chronic gout, unspecified site, without tophus (tophi): Secondary | ICD-10-CM | POA: Insufficient documentation

## 2022-02-10 DIAGNOSIS — E7800 Pure hypercholesterolemia, unspecified: Secondary | ICD-10-CM | POA: Insufficient documentation

## 2022-02-10 DIAGNOSIS — G4733 Obstructive sleep apnea (adult) (pediatric): Secondary | ICD-10-CM | POA: Insufficient documentation

## 2022-02-10 DIAGNOSIS — I2510 Atherosclerotic heart disease of native coronary artery without angina pectoris: Secondary | ICD-10-CM | POA: Insufficient documentation

## 2022-02-10 DIAGNOSIS — Z85820 Personal history of malignant melanoma of skin: Secondary | ICD-10-CM | POA: Insufficient documentation

## 2022-02-10 NOTE — Patient Instructions (Addendum)
Good to see you  ?Start Tylenol [phone] mg 2-3 times a day for pain relief  ?Hip HEP  ?MRI referral  ?Follow up 3 days after MRI or 2 weeks depending on trip to discuss results  ? ?

## 2022-02-10 NOTE — Progress Notes (Signed)
? ? Kristopher Rowe D.Kristopher Rowe ?[facility] ?[place] ?Phone: [phone] ?  ?Assessment and Plan:   ?  ?1. Low back pain, unspecified back pain laterality, unspecified chronicity, unspecified whether sciatica present ?2. Right hip pain ?3. Acute right hip pain ?4. History of total hip replacement, right ?-Acute, no improvement, initial sports medicine visit ?- Unclear etiology of right hip and right anterior thigh pain.  DVT and arterial disease ruled out by ultrasound.  My primary concern is hardware from prior total hip replacement.  Hardware looks to be in place on x-ray imaging, so we will proceed with MRI for further evaluation ?- While awaiting MRI, we will proceed with conservative therapy including Tylenol 500 to 1000 mg 2-3 times a day for day-to-day pain relief ?- Start HEP for hip ?- X-ray obtained of right hip, lumbar spine.  My interpretation: No acute fracture or dislocation.  Intact hardware from right total hip replacement.  Intact hardware from lumbar fusion.  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  ?Pertinent previous records reviewed include vascular ultrasound report from 02/06/2022, cardiology note 02/04/2022, cardiology note 01/28/2022 ?  ?Follow Up: 3 days after MRI to review results and discuss treatment plan ?  ?Subjective:   ?I, Kristopher Rowe, am serving as a Education administrator for [employer] ? ?Chief Complaint: upper leg and hip pain  ? ?HPI:  ? ?02/10/22 ?Patient is a 77 year old male complaining of upper leg and hip pain. Patient states pain started about 5-6 weeks ago had to stop playing pickle ball , pain in the quads that radiates up to hip right hip , cant sit or stand for long times , has to move around a lot to get comfortable, isn't able to sleep ,no numbness or tingling feel like when he had a ruptured disc in his back, no ib or tylenol, no clicking or popping hx of hip replacement , hx of  disc fusion,   ? ?Relevant Historical Information: Total right hip replacement, lumbar fusion, CAD, PVD, DM type II ? ?Additional pertinent review of systems negative. ? ? ?Current Outpatient Medications:  ?  ACCU-CHEK GUIDE test strip, AS DIRECTED IN VITRO ONCE A DAY 90 DAYS, Disp: , Rfl:  ?  Accu-Chek Softclix Lancets lancets, See admin instructions., Disp: , Rfl:  ?  allopurinol (ZYLOPRIM) 300 MG tablet, Take 300 mg by mouth daily., Disp: , Rfl:  ?  aspirin EC 81 MG tablet, Take 81 mg by mouth daily. Swallow whole., Disp: , Rfl:  ?  atenolol (TENORMIN) 50 MG tablet, Take 50 mg by mouth daily., Disp: , Rfl:  ?  BREO ELLIPTA 100-25 MCG/ACT AEPB, TAKE 1 PUFF BY MOUTH EVERY DAY, Disp: 60 each, Rfl: 3 ?  cholecalciferol (VITAMIN D) 25 MCG (1000 UNIT) tablet, Take 1,000 Units by mouth daily., Disp: , Rfl:  ?  Evolocumab 140 MG/ML SOAJ, Inject 1 mL into the skin every 14 (fourteen) days., Disp: 6 mL, Rfl: 3 ?  fenofibrate 160 MG tablet, Take 160 mg by mouth daily., Disp: , Rfl:  ?  fluticasone (FLONASE) 50 MCG/ACT nasal spray, Place into both nostrils., Disp: , Rfl:  ?  hydrochlorothiazide (MICROZIDE) 12.5 MG capsule, Take 1 capsule (12.5 mg total) by mouth daily., Disp: 30 capsule, Rfl: 0 ?  hydrochlorothiazide (MICROZIDE) 12.5 MG capsule, Take 1 capsule (12.5 mg total) by mouth daily., Disp: 90 capsule, Rfl: 3 ?  icosapent  Ethyl (VASCEPA) 1 g capsule, Take 2 capsules (2 g total) by mouth 2 (two) times daily., Disp: 120 capsule, Rfl: 0 ?  icosapent Ethyl (VASCEPA) 1 g capsule, Take 2 capsules (2 g total) by mouth 2 (two) times daily., Disp: 360 capsule, Rfl: 3 ?  indomethacin (INDOCIN) 25 MG capsule, Take 25 mg by mouth 2 (two) times daily. Take 1 capsule twice daily with food or milk for 14 days, Disp: , Rfl:  ?  JARDIANCE 10 MG TABS tablet, Take 10 mg by mouth daily., Disp: , Rfl:  ?  metFORMIN (GLUCOPHAGE) 1000 MG tablet, Take 1,000 mg by mouth 2 (two) times daily., Disp: , Rfl:  ?  valsartan (DIOVAN) 160 MG tablet,  Take 160 mg by mouth daily., Disp: , Rfl:  ?  varenicline (CHANTIX) 1 MG tablet, Take 1 mg by mouth daily., Disp: , Rfl:   ? ?Objective:   ?  ?Vitals:  ? 02/10/22 1417  ?BP: 130/78  ?Pulse: 65  ?SpO2: 97%  ?Weight: 209 lb (94.8 kg)  ?Height: '5\' 10"'$  (1.778 m)  ?  ?  ?Body mass index is 29.99 kg/m?.  ?  ?Physical Exam:   ? ?General: awake, alert, and oriented no acute distress, nontoxic ?Skin: no suspicious lesions or rashes ?Neuro:sensation intact distally with no dificits, normal muscle tone, no atrophy, strength 5/5 in all tested lower ext groups ?Psych: normal mood and affect, speech clear ? ?Right hip: ?No deformity, swelling or wasting ?ROM Flexion 80, ext 25, IR 35, ER 40 ?NTTP over the hip flexors, greater troch, glute musculature, si joint, lumbar spine ?Negative log roll with FROM ?Positive FABER ?Positive FADIR ?Negative Piriformis test ?  ?Gait slowed, mildly favoring left leg ? ? ?Electronically signed by:  ?Kristopher Rowe D.Kristopher Rowe ?[facility] ?3:37 PM 02/10/22 ?

## 2022-02-11 ENCOUNTER — Ambulatory Visit (INDEPENDENT_AMBULATORY_CARE_PROVIDER_SITE_OTHER): Payer: Medicare Other

## 2022-02-11 DIAGNOSIS — Z96641 Presence of right artificial hip joint: Secondary | ICD-10-CM | POA: Diagnosis not present

## 2022-02-11 DIAGNOSIS — M545 Low back pain, unspecified: Secondary | ICD-10-CM | POA: Diagnosis not present

## 2022-02-11 DIAGNOSIS — M25551 Pain in right hip: Secondary | ICD-10-CM | POA: Diagnosis not present

## 2022-02-16 ENCOUNTER — Other Ambulatory Visit: Payer: Self-pay | Admitting: Physician Assistant

## 2022-02-20 NOTE — Progress Notes (Signed)
? ? Kristopher Rowe ?Kristopher Rowe ?[place] ?Phone: [phone] ?  ?Assessment and Plan:   ?  ?1. Right hip pain ?2. Acute right hip pain ?3. History of total hip replacement, right ?-Chronic with exacerbation, subsequent visit ?- Reviewed MRI right hip from 02/11/2022 with patient showing stable appearance of right hip replacement ?- Overall patient has had significant improvement in pain with activity modification, Tylenol 1000 mg twice daily ?- May restart physical activity gradually ?- Start Tylenol 500 to 1000 mg tablets 2-3 times a day for day-to-day pain relief  ?  ?Pertinent previous records reviewed include MRI hip/4/23 ?  ?Follow Up: As needed if no improving or worsening of symptoms ?  ?Subjective:   ?I, Kristopher Rowe, am serving as a Education administrator for Doctor Kristopher Rowe ?  ?Chief Complaint: upper leg and hip pain  ?  ?HPI:  ?  ?02/10/22 ?Patient is a 76 year old male complaining of upper leg and hip pain. Patient states pain started about 5-6 weeks ago had to stop playing pickle ball , pain in the quads that radiates up to hip right hip , cant sit or stand for long times , has to move around a lot to get comfortable, isn't able to sleep ,no numbness or tingling feel like when he had a ruptured disc in his back, no ib or tylenol, no clicking or popping hx of hip replacement , hx of disc fusion,   ? ?02/21/2022 ?Patient states that the tylenol has been helping control the pain  ?  ?Relevant Historical Information: Total right hip replacement, lumbar fusion, CAD, PVD, DM type II ?Additional pertinent review of systems negative. ? ? ?Current Outpatient Medications:  ?  ACCU-CHEK GUIDE test strip, AS DIRECTED IN VITRO ONCE A DAY 90 DAYS, Disp: , Rfl:  ?  Accu-Chek Softclix Lancets lancets, See admin instructions., Disp: , Rfl:  ?  allopurinol (ZYLOPRIM) 300 MG tablet, Take 300 mg by mouth daily., Disp: , Rfl:  ?  aspirin EC 81 MG tablet, Take 81 mg by  mouth daily. Swallow whole., Disp: , Rfl:  ?  atenolol (TENORMIN) 50 MG tablet, Take 50 mg by mouth daily., Disp: , Rfl:  ?  BREO ELLIPTA 100-25 MCG/ACT AEPB, TAKE 1 PUFF BY MOUTH EVERY DAY, Disp: 60 each, Rfl: 3 ?  cholecalciferol (VITAMIN D) 25 MCG (1000 UNIT) tablet, Take 1,000 Units by mouth daily., Disp: , Rfl:  ?  Evolocumab 140 MG/ML SOAJ, Inject 1 mL into the skin every 14 (fourteen) days., Disp: 6 mL, Rfl: 3 ?  fenofibrate 160 MG tablet, Take 160 mg by mouth daily., Disp: , Rfl:  ?  fluticasone (FLONASE) 50 MCG/ACT nasal spray, Place into both nostrils., Disp: , Rfl:  ?  hydrochlorothiazide (MICROZIDE) 12.5 MG capsule, Take 1 capsule (12.5 mg total) by mouth daily., Disp: 90 capsule, Rfl: 3 ?  hydrochlorothiazide (MICROZIDE) 12.5 MG capsule, TAKE 1 CAPSULE BY MOUTH EVERY DAY, Disp: 90 capsule, Rfl: 3 ?  icosapent Ethyl (VASCEPA) 1 g capsule, Take 2 capsules (2 g total) by mouth 2 (two) times daily., Disp: 120 capsule, Rfl: 0 ?  icosapent Ethyl (VASCEPA) 1 g capsule, Take 2 capsules (2 g total) by mouth 2 (two) times daily., Disp: 360 capsule, Rfl: 3 ?  indomethacin (INDOCIN) 25 MG capsule, Take 25 mg by mouth 2 (two) times daily. Take 1 capsule twice daily with food or milk for 14 days, Disp: , Rfl:  ?  JARDIANCE 10 MG TABS tablet, Take 10 mg by mouth daily., Disp: , Rfl:  ?  metFORMIN (GLUCOPHAGE) 1000 MG tablet, Take 1,000 mg by mouth 2 (two) times daily., Disp: , Rfl:  ?  valsartan (DIOVAN) 160 MG tablet, Take 160 mg by mouth daily., Disp: , Rfl:  ?  varenicline (CHANTIX) 1 MG tablet, Take 1 mg by mouth daily., Disp: , Rfl:   ? ?Objective:   ?  ?Vitals:  ? 02/21/22 1354  ?BP: 122/88  ?Pulse: 61  ?SpO2: 97%  ?Weight: 207 lb (93.9 kg)  ?Height: '5\' 10"'$  (1.778 m)  ?  ?  ?Body mass index is 29.7 kg/m?.  ?  ?Physical Exam:   ? ?General: awake, alert, and oriented no acute distress, nontoxic ?Skin: no suspicious lesions or rashes ?Neuro:sensation intact distally with no dificits, normal muscle tone, no atrophy,  strength 5/5 in all tested lower ext groups ?Psych: normal mood and affect, speech clear ? ?Right hip: ?No deformity, swelling or wasting ?ROM Flexion 90, ext 30, IR 45, ER 45 ?NTTP over the hip flexors, greater troch, glute musculature, si joint, lumbar spine ?Negative log roll with FROM ?Negative FABER ?Negative FADIR ?Negative Piriformis test ?Negative trendelenberg ?Gait normal  ? ? ?Electronically signed by:  ?Kristopher Rowe ?Kristopher Rowe ?3:35 PM 02/21/22 ?

## 2022-02-21 ENCOUNTER — Ambulatory Visit (INDEPENDENT_AMBULATORY_CARE_PROVIDER_SITE_OTHER): Payer: Medicare Other | Admitting: Sports Medicine

## 2022-02-21 VITALS — BP 122/88 | HR 61 | Ht 70.0 in | Wt 207.0 lb

## 2022-02-21 DIAGNOSIS — Z96641 Presence of right artificial hip joint: Secondary | ICD-10-CM

## 2022-02-21 DIAGNOSIS — M25551 Pain in right hip: Secondary | ICD-10-CM | POA: Diagnosis not present

## 2022-02-21 NOTE — Patient Instructions (Signed)
Good to see you   

## 2022-02-25 ENCOUNTER — Ambulatory Visit (INDEPENDENT_AMBULATORY_CARE_PROVIDER_SITE_OTHER): Payer: Medicare Other

## 2022-02-25 ENCOUNTER — Ambulatory Visit (INDEPENDENT_AMBULATORY_CARE_PROVIDER_SITE_OTHER): Payer: Medicare Other | Admitting: Sports Medicine

## 2022-02-25 VITALS — BP 122/80 | HR 60 | Ht 70.0 in | Wt 207.0 lb

## 2022-02-25 DIAGNOSIS — M25562 Pain in left knee: Secondary | ICD-10-CM

## 2022-02-25 NOTE — Patient Instructions (Addendum)
Good to see you  ?Tylenol [phone] mg 2-3 times a day for pain relief  ?Use a knee brace when physically active   ?As needed follow up if no improvement 2-3 week  ?

## 2022-02-25 NOTE — Progress Notes (Signed)
? ? Kristopher Rowe D.Kristopher Rowe ?[facility] ?[place] ?Phone: [phone] ?  ?Assessment and Plan:   ?  ?1. Acute on chronic pain of left knee ?-Chronic with exacerbation, initial sports medicine visit ?- Likely an acute flare of knee pain after fall onto the knee 4 days ago with history of 2 prior ACL repairs ?- Lachman and anterior drawer have increased laxity compared to right, though they still feel like they have an endpoint, so I believe that this is patient's baseline and does not represent a new tear based on physical exam and no edema/effusion, no episodes of knee giving out ?- Recommend using supportive knee brace when physically active ?- Start Tylenol 500 to 1000 mg tablets 2-3 times a day for day-to-day pain relief  ?- X-ray obtained in clinic.  My interpretation: No acute fracture or dislocation.  Postsurgical changes from prior ACL repair.  Mild degenerative cortical changes along patella and medial and lateral compartment ?- DG Knee AP/LAT W/Sunrise Left; Future  ?  ?Pertinent previous records reviewed include none ?  ?Follow Up: As needed if no improving or worsening of symptoms in 2 to 3 weeks.  Could consider CSI versus MRI of knee ?  ?Subjective:   ?I, Kristopher Rowe, am serving as a Education administrator for Doctor Kristopher Rowe ? ?Chief Complaint: left knee pain  ? ?HPI:  ? ?02/25/22 ?Patient is a 76 year old male complaining of left knee pain. Patient states he fell walking down the steps and his dog bumped into him and knocked him off his stride on his butt and his left knee super flexed to where the heel was touching his butt 3-4 days ago, no numbness or tingling, last night had clicking one time the first and only time that it ever did that , has been taking tylenol pain is posterior knee , stays right there very precise and located to one spot knee has felt unstable up until last night this morning he woke up and it hasnt' bothered him as much   ? ?Relevant Historical Information: 2 prior ACL repairs ? ?Additional pertinent review of systems negative. ? ? ?Current Outpatient Medications:  ?  ACCU-CHEK GUIDE test strip, AS DIRECTED IN VITRO ONCE A DAY 90 DAYS, Disp: , Rfl:  ?  Accu-Chek Softclix Lancets lancets, See admin instructions., Disp: , Rfl:  ?  allopurinol (ZYLOPRIM) 300 MG tablet, Take 300 mg by mouth daily., Disp: , Rfl:  ?  aspirin EC 81 MG tablet, Take 81 mg by mouth daily. Swallow whole., Disp: , Rfl:  ?  atenolol (TENORMIN) 50 MG tablet, Take 50 mg by mouth daily., Disp: , Rfl:  ?  BREO ELLIPTA 100-25 MCG/ACT AEPB, TAKE 1 PUFF BY MOUTH EVERY DAY, Disp: 60 each, Rfl: 3 ?  cholecalciferol (VITAMIN D) 25 MCG (1000 UNIT) tablet, Take 1,000 Units by mouth daily., Disp: , Rfl:  ?  Evolocumab 140 MG/ML SOAJ, Inject 1 mL into the skin every 14 (fourteen) days., Disp: 6 mL, Rfl: 3 ?  fenofibrate 160 MG tablet, Take 160 mg by mouth daily., Disp: , Rfl:  ?  fluticasone (FLONASE) 50 MCG/ACT nasal spray, Place into both nostrils., Disp: , Rfl:  ?  hydrochlorothiazide (MICROZIDE) 12.5 MG capsule, Take 1 capsule (12.5 mg total) by mouth daily., Disp: 90 capsule, Rfl: 3 ?  hydrochlorothiazide (MICROZIDE) 12.5 MG capsule, TAKE 1 CAPSULE BY MOUTH EVERY DAY, Disp: 90 capsule, Rfl: 3 ?  icosapent Ethyl (VASCEPA) 1  g capsule, Take 2 capsules (2 g total) by mouth 2 (two) times daily., Disp: 120 capsule, Rfl: 0 ?  icosapent Ethyl (VASCEPA) 1 g capsule, Take 2 capsules (2 g total) by mouth 2 (two) times daily., Disp: 360 capsule, Rfl: 3 ?  indomethacin (INDOCIN) 25 MG capsule, Take 25 mg by mouth 2 (two) times daily. Take 1 capsule twice daily with food or milk for 14 days, Disp: , Rfl:  ?  JARDIANCE 10 MG TABS tablet, Take 10 mg by mouth daily., Disp: , Rfl:  ?  metFORMIN (GLUCOPHAGE) 1000 MG tablet, Take 1,000 mg by mouth 2 (two) times daily., Disp: , Rfl:  ?  valsartan (DIOVAN) 160 MG tablet, Take 160 mg by mouth daily., Disp: , Rfl:  ?  varenicline (CHANTIX) 1  MG tablet, Take 1 mg by mouth daily., Disp: , Rfl:   ? ?Objective:   ?  ?Vitals:  ? 02/25/22 1049  ?BP: 122/80  ?Pulse: 60  ?SpO2: 97%  ?Weight: 207 lb (93.9 kg)  ?Height: '5\' 10"'$  (1.778 m)  ?  ?  ?Body mass index is 29.7 kg/m?.  ?  ?Physical Exam:   ? ?General:  awake, alert oriented, no acute distress nontoxic ?Skin: no suspicious lesions or rashes ?Neuro:sensation intact, no deficits, strength 5/5 with no deficits, no atrophy, normal muscle tone ?Psych: No signs of anxiety, depression or other mood disorder ? ?Left knee: ?No swelling ?No deformity ?Neg fluid wave, joint milking ?ROM Flex 100, Ext 5 ?NTTP over the quad tendon, medial fem condyle, lat fem condyle, patella, plica, patella tendon, tibial tuberostiy, fibular head, posterior fossa, pes anserine bursa, gerdy's tubercle, medial jt line, lateral jt line ?Equivocal anterior and negative posterior drawer ?Equivocal lachman ?Neg sag sign ?Negative varus stress ?Negative valgus stress ?Negative McMurray ?Negative Thessaly ? ?Gait normal  ? ? ?Electronically signed by:  ?Kristopher Rowe D.Kristopher Rowe ?[facility] ?11:13 AM 02/25/22 ?

## 2022-03-20 ENCOUNTER — Telehealth: Payer: Self-pay | Admitting: Cardiology

## 2022-03-20 NOTE — Telephone Encounter (Addendum)
Pt is calling to inform Dr. Johney Frame that since yesterday, he has been experiencing dizziness/lightheadedness with positional changes.  ? ?Pt states he either has food poisoning or the stomach virus.  He states nausea and vomiting started yesterday. He reports he's had this in the past and his symptoms are similar. ? ?Pt states he has vomited the majority of the day yesterday and some this morning, but vomiting has now subsided. He states he thinks it was a 24 hour stomach bug, and he's on the back end of the virus.  ? ?Pt states his dizziness started yesterday and has carried into today.  ? ?He states he is taking all his cardiac meds with no missed doses, despite having vomited over the last 24 hrs.  He denies any other cardiac complaints like chest pain, sob, doe, diaphoresis, or syncopal episodes. ? ?He states he called his PCP about this but they advised him to call Cards, due to his complaints of dizziness. ? ?Pt has not been monitoring his BP/HR recently.   He states his pressures usually run in the 120s/80s.  He does have a cuff so instructed him to go ahead and take this while I was on the phone with him.  Pt took his BP and reported it is 108/71 HR-60s. ? ?Informed the pt that his symptoms seem to be related to dehydration from vomiting over the last 24 hrs. He very well could be orthostatic secondary to dehydration.  Advised the pt to increase his po hydration to avoid further dehydration.  Advised him to try and drink an electrolyte replacement drink like gatorade zero.  Educated him on the [employer] and to proceed with food as tolerated thereafter.  Advised him to avoid dairy products and any fried foods for the next several days.  Advised him to make sure he changes positions slowly allowing appropriate circulation.  Advised him to wear compressions during the day, if he has them on hand.  Advised him to continue monitoring his numbers and report them to [place] as needed and/or if his BP continues to drop  with recommendations provided.  Advised him that he could possibly hold his HCTZ tomorrow to avoid further dehydration.  Advised him he can gauge this based off of his BP readings in the AM.  Parameters provided to him.  ?ED precautions provided to the pt if symptoms were to worsen or persist.  ?Advised him to keep [place] posted as needed.  ?Informed the pt that I will route  this message to Dr. Johney Frame to make her aware of this plan and to further advise as needed.  ?Pt verbalized understanding and agrees with this plan. ?

## 2022-03-20 NOTE — Telephone Encounter (Signed)
?  STAT if patient feels like he/she is going to faint  ? ?Are you dizzy now? No. Patient is sitting down  ? ?Do you feel faint or have you passed out? No.  ? ?Do you have any other symptoms? Food poisoning ? ?Have you checked your HR and BP (record if available)? No ? ? ?

## 2022-03-20 NOTE — Telephone Encounter (Signed)
Completely agree with your excellent and very thorough plan. Sounds orthostatic and dehydrated. Agree he should hold his HCTZ tomorrow and resume once his symptoms are better.  ?

## 2022-03-21 NOTE — Telephone Encounter (Signed)
Pt made aware of advisement yesterday. ?

## 2022-03-27 ENCOUNTER — Telehealth: Payer: Self-pay | Admitting: Cardiology

## 2022-03-27 MED ORDER — AMLODIPINE BESYLATE 5 MG PO TABS: 5.0000 mg | ORAL_TABLET | Freq: Every day | ORAL | 2 refills | Status: DC

## 2022-03-27 NOTE — Telephone Encounter (Signed)
Pt aware of recommendations per Dr. Johney Frame.  Pt is interested in starting amlodipine 5 mg po daily. He is aware to increase his po water intake.  Advised him to continue monitoring his pressures at home.  Advised him to watch his salt intake.  Confirmed the pharmacy of choice with the pt.  Pt verbalized understanding and agrees with this plan.

## 2022-03-27 NOTE — Telephone Encounter (Signed)
  Pt c/o BP issue: STAT if pt c/o blurred vision, one-sided weakness or slurred speech  1. What are your last 5 BP readings?  175/90 Yesterday 150/80 Today 149/80  2. Are you having any other symptoms (ex. Dizziness, headache, blurred vision, passed out)? Dizziness   3. What is your BP issue? Pt said, for the past couple of days, his BP is elevated. He still feeling dizzy. He wanted to know what he needs to do now

## 2022-03-27 NOTE — Telephone Encounter (Signed)
Certainly we can increase his medication to try to lower his blood pressure and see how he does. I do not think his dizziness is related to his blood pressure as this usually occurs with low blood pressure and he has been running fairly high. HCTZ can dehydrate him so he should be sure he is drinking water throughout the day.   Can we start him on amlodipine '5mg'$  daily? His K is borderline and I do not think he will tolerate higher dose of valsartan and his HR are in the 60s. If he is having dizziness, I do not want to increase his HCTZ in case he is having orthostasis.    Agree with you that he needs to watch his salt as this is likely driving his blood pressure up.

## 2022-03-27 NOTE — Telephone Encounter (Signed)
Pt is calling to report to Dr. Johney Frame that over the last 2 days his pressures have been elevated.   Pt states he has dizziness with elevated pressures. Pt called last week with complaints of dizziness and low BP, associated with the stomach virus.  He now is complaining his pressures are too high.   He denies any other cardiac complaints like chest pain, sob, doe, palpitations, blurred vision, weakness, headache, pre-syncopal or syncopal episodes. He has no nausea or vomiting.   Below is last few recordings pt provided over the last 2 days.   -Yesterday early morning and before meds:  175/90 HR-60's -Later on in the day yesterday: 150/80 HR-60'S -This morning 1 hour after med administration:  149/80 HR-60'S  Pt states he is taking all his meds as prescribed with no skipped doses.  Pt states he is correctly taking his pressures.  Asked if there is a change in his diet and is he having increased salt in his diet.  Pt states "I don't eat as many vegetables as I should and I mostly eat pork, beef, and chicken."  He also replied "I only ate sushi last night."  Pt is concerned with elevated readings and being at risk for a stroke.    Did advise the pt that he needs to eat a well balanced meal and obtain healthy nutrients with eating vegetables.  Informed him that he should avoid red meat, salty foods, saturated fats, and processed and prepackaged foods.  Advised him to drink water and refrain from drinking sugar or salty drinks.  Pt education provided on how to correctly take his BP/HR and when to take these recordings. Pt replied "I already know how to do all that."    Informed the pt that I will route this message to Dr. Johney Frame to further review and advise on, and I will follow-up with him accordingly thereafter.  Pt states he will be away from his phone today from the hours of 1 pm to 3 pm, playing pickle ball.  Pt verbalized understanding and agrees with this plan.

## 2022-03-27 NOTE — Telephone Encounter (Signed)
Left the pt a message to call the office back to endorse recommendations per Dr. Pemberton. 

## 2022-04-08 ENCOUNTER — Telehealth: Payer: Self-pay

## 2022-04-08 NOTE — Telephone Encounter (Signed)
Called on 04/08/2022 and left voicemail for patient to call office back. Got letter from primary care to schedule follow up with patient regarding cough. Last seen in September 2022.

## 2022-05-09 ENCOUNTER — Other Ambulatory Visit: Payer: Self-pay | Admitting: Physician Assistant

## 2022-05-09 DIAGNOSIS — R1900 Intra-abdominal and pelvic swelling, mass and lump, unspecified site: Secondary | ICD-10-CM

## 2022-05-14 ENCOUNTER — Ambulatory Visit
Admission: RE | Admit: 2022-05-14 | Discharge: 2022-05-14 | Disposition: A | Discharge status: Home / Self Care | Payer: Medicare Other | Source: Ambulatory Visit | Attending: Physician Assistant | Admitting: Physician Assistant

## 2022-05-14 ENCOUNTER — Other Ambulatory Visit: Payer: Self-pay | Admitting: Family Medicine

## 2022-05-14 DIAGNOSIS — R935 Abnormal findings on diagnostic imaging of other abdominal regions, including retroperitoneum: Secondary | ICD-10-CM

## 2022-05-14 DIAGNOSIS — R1900 Intra-abdominal and pelvic swelling, mass and lump, unspecified site: Secondary | ICD-10-CM

## 2022-05-15 ENCOUNTER — Ambulatory Visit
Admission: RE | Admit: 2022-05-15 | Discharge: 2022-05-15 | Disposition: A | Discharge status: Home / Self Care | Payer: Medicare Other | Source: Ambulatory Visit | Attending: Family Medicine | Admitting: Family Medicine

## 2022-05-15 DIAGNOSIS — R935 Abnormal findings on diagnostic imaging of other abdominal regions, including retroperitoneum: Secondary | ICD-10-CM

## 2022-05-15 MED ORDER — IOPAMIDOL (ISOVUE-300) INJECTION 61%
80.0000 mL | Freq: Once | INTRAVENOUS | Status: AC | PRN
  Administered 2022-05-15: 80 mL via INTRAVENOUS

## 2022-06-02 ENCOUNTER — Other Ambulatory Visit: Payer: Self-pay | Admitting: Pharmacist

## 2022-06-02 DIAGNOSIS — I2510 Atherosclerotic heart disease of native coronary artery without angina pectoris: Secondary | ICD-10-CM

## 2022-06-02 DIAGNOSIS — E785 Hyperlipidemia, unspecified: Secondary | ICD-10-CM

## 2022-06-02 MED ORDER — EVOLOCUMAB 140 MG/ML ~~LOC~~ SOAJ: 1.0000 mL | SUBCUTANEOUS | 3 refills | Status: DC

## 2022-07-23 NOTE — Progress Notes (Deleted)
Cardiology Office Note:    Date:  07/23/2022   ID:  Kristopher Rowe, DOB 11-30-1945, MRN [id number]  PCP:  [name], MD  [facility] Cardiologist:  Freada Bergeron, MD  [facility] Electrophysiologist:  None   Referring MD: [name], MD    History of Present Illness:    Kristopher Rowe is a 76 y.o. male with a hx of CAD s/p remote history of PCI to RCA and OM1, DMII, HTN, PAD s/p PTA and stent of left common iliac (2016), prior tobacco abuse, OSA, and gout who presents to clinic for follow-up.  Patient moved to [place] from [place] and established care in 12/2020 . Has extensive coronary history with history of RCA stent and OM1 in 2012. Also has left common iliac stenting in 2016 for severe claudication.  TTE with EF 50-55, no RWMA, normal RVSF, normal PASP, trivial MR.   Was seen on 01/28/22 where he was doing well with no anginal or claudication symptoms.   Was seen back on 02/04/22 due to significant leg pain in his quad area. We obtained vascular ABI which were normal bilaterally.    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[place])    ED (erectile dysfunction)    Gout    Hypertension    Melanoma ([place])    OSA (obstructive sleep apnea)    PAD (peripheral artery disease) (HCC)    Tobacco use     No past surgical history on file.  Current Medications: No outpatient medications have been marked as taking for the 07/25/22 encounter (Appointment) with Freada Bergeron, MD.     Allergies:   Penicillins   Social History   Socioeconomic History   Marital status: Divorced    Spouse name: Not on file   Number of children: Not on file   Years of education: Not on file   Highest education level: Not on file  Occupational History   Not on file  Tobacco Use   Smoking status: Former    Packs/day: 2.00    Years: 50.00    Total pack years: 100.00    Types: Cigarettes    Smokeless tobacco: Never  Substance and Sexual Activity   Alcohol use: Not on file   Drug use: Not on file   Sexual activity: Not on file  Other Topics Concern   Not on file  Social History Narrative   Not on file   Social Determinants of Health   Financial Resource Strain: Not on file  Food Insecurity: Not on file  Transportation Needs: Not on file  Physical Activity: Not on file  Stress: Not on file  Social Connections: Not on file     Family History: The patient's family history includes Hodgkin's lymphoma in his father; Other in his mother.  ROS:   Please see the history of present illness.    Review of Systems  Constitutional:  Negative for chills and fever.  HENT:  Negative for congestion and nosebleeds.   Eyes:  Negative for blurred vision.  Respiratory:  Negative for shortness of breath.   Cardiovascular:  Positive for claudication. Negative for chest pain, palpitations, orthopnea, leg swelling and PND.  Gastrointestinal:  Negative for melena, nausea and vomiting.  Genitourinary:  Negative for hematuria.  Musculoskeletal:  Positive for joint pain and myalgias.  Skin:  Negative for itching and rash.  Neurological:  Negative for dizziness and headaches.  Endo/Heme/Allergies:  Does not bruise/bleed easily.  Psychiatric/Behavioral:  The patient is not nervous/anxious and does not have insomnia.     EKGs/Labs/Other Studies Reviewed:    The following studies were reviewed today: LE ABI 01/30/22: Summary:  Right: Resting right ankle-brachial index is within normal range. No  evidence of significant right lower extremity arterial disease. The right  toe-brachial index is normal.   Left: Resting left ankle-brachial index is within normal range. No  evidence of significant left lower extremity arterial disease. The left  toe-brachial index is normal.       *See table(s) above for measurements and observations.   Chest CT 07/09/21 IMPRESSION: 1. Mild central  bronchial wall thickening suggesting chronic bronchitis. Lungs are clear. 2. Diffuse hepatic steatosis. 3. Aortic atherosclerosis and coronary artery atherosclerosis (ICD10-I70.0).   Echo 01/24/2021 1. Left ventricular ejection fraction, by estimation, is 50 to 55%. The left ventricle has low normal function. The left ventricle has no regional wall motion abnormalities. Left ventricular diastolic parameters are indeterminate.   2. Right ventricular systolic function is normal. The right ventricular size is normal. There is normal pulmonary artery systolic pressure.   3. The mitral valve is grossly normal. Trivial mitral valve  regurgitation.   4. The aortic valve is tricuspid. There is mild calcification of the aortic valve. There is mild thickening of the aortic valve. Aortic valve regurgitation is not visualized.   Patient brought records today which were reviewed. 2016: Left iliac artery stenting 2012: OM1 stent 2009: Myoview with inferior infarct but no ischemia 1999: RCA stent  EKG:    EKG is personally reviewed. 02/04/2022: EKG was not ordered. 01/28/22: Sinus bradycardia, rate 56 bpm 12/12/20: sinus bradycardia with HR 57; inferior q-waves  Recent Labs: 11/15/2021: Hemoglobin 16.7; Platelets 384 01/22/2022: ALT 36; BUN 25; Creatinine, Ser 1.75; Potassium 4.9; Sodium 141   Recent Lipid Panel    Component Value Date/Time   CHOL 153 01/22/2022 0802   TRIG 320 (H) 01/22/2022 0802   HDL 33 (L) 01/22/2022 0802   CHOLHDL 4.6 01/22/2022 0802   LDLCALC 69 01/22/2022 0802      Physical Exam:    VS:  There were no vitals taken for this visit.    Wt Readings from Last 3 Encounters:  02/25/22 207 lb (93.9 kg)  02/21/22 207 lb (93.9 kg)  02/10/22 209 lb (94.8 kg)     GEN:  Well nourished, well developed in no acute distress HEENT: Normal NECK: No JVD; No carotid bruits CARDIAC: Bradycardic, regular, 1/6 systolic murmur. No rubs, gallops. 2+ DP pulses bilaterally. Could not  palpate PT.  RESPIRATORY:  Clear to auscultation without rales, wheezing or rhonchi  ABDOMEN: Soft, non-tender, non-distended MUSCULOSKELETAL:  No edema; No deformity  SKIN: Warm and dry NEUROLOGIC:  Alert and oriented x 3 PSYCHIATRIC:  Normal affect    ASSESSMENT:    No diagnosis found.  PLAN:    In order of problems listed above:  #PAD s/p stenting to left iliac artery: ABI 01/2022 normal bilaterally. Currently doing well with no claudication symptoms.  -Continue ASA '81mg'$  daily -Continue vascepa 2g BID -Continue fenofibrate '160mg'$  daily -Continue repatha '140mg'$  q 2 weeks -Continue healthy lifestyle with diet and exercise  #Multivessel CAD s/p remote history of PCI to RCA and OM1: Previously followed in [place] with history of PCI to RCA and OM1. TTE with LVEF 50-55%. No current anginal symptoms. -Continue ASA '81mg'$  daily -Continue repatha '140mg'$  q 2weeks -Continue atenolol '50mg'$  daily -Continue Valsartan '160mg'$  daily -Continue vascepa  2g BID -Continue fenofibrate '160mg'$  daily  #HTN:. Controlled.  -Continue HCTZ 12.'5mg'$  daily -Continue valsartan '160mg'$  daily -Continue atenolol '50mg'$  daily -Low Na diet -Monitor blood pressures at home  #HLD: -Continue vascepa 2g BID -Continue fenofibrate '160mg'$  daily -Continue repatha '140mg'$  q 2 weeks -LDL 69  #DMII: -Continue metformin  #Prior Tobacco Abuse: -Continue varenicline  Follow-up:  6 months.  Medication Adjustments/Labs and Tests Ordered: Current medicines are reviewed at length with the patient today.  Concerns regarding medicines are outlined above.   No orders of the defined types were placed in this encounter.  No orders of the defined types were placed in this encounter.  There are no Patient Instructions on file for this visit.  I,Mathew Stumpf,acting as a Education administrator for Freada Bergeron, MD.,have documented all relevant documentation on the behalf of Freada Bergeron, MD,as directed by  Freada Bergeron,  MD while in the presence of Freada Bergeron, MD.  I, Freada Bergeron, MD, have reviewed all documentation for this visit. The documentation on 07/23/22 for the exam, diagnosis, procedures, and orders are all accurate and complete.   Signed, Freada Bergeron, MD  07/23/2022 3:36 PM    [place]

## 2022-07-24 NOTE — Progress Notes (Signed)
Cardiology Office Note:    Date:  07/25/2022   ID:  Kristopher Rowe, DOB 03-08-1946, MRN [id number]  PCP:  [name], MD  [facility] Cardiologist:  Freada Bergeron, MD  [facility] Electrophysiologist:  None   Referring MD: [name], MD    History of Present Illness:    Kristopher Rowe is a 76 y.o. male with a hx of CAD s/p remote history of PCI to RCA and OM1, DMII, HTN, PAD s/p PTA and stent of left common iliac (2016), prior tobacco abuse, OSA, and gout who presents to clinic for follow-up.  Patient moved to [place] from [place] and established care in 12/2020 . Has extensive coronary history with history of RCA stent and OM1 in 2012. Also has left common iliac stenting in 2016 for severe claudication.  TTE with EF 50-55, no RWMA, normal RVSF, normal PASP, trivial MR.   Was seen on 01/28/22 where he was doing well with no anginal or claudication symptoms.   Was seen back on 02/04/22 due to significant leg pain in his quad area. We obtained vascular ABI which were normal bilaterally.   Today, the patient states that his LE muscle cramps have mostly resolved. He believes his symptoms were likely due to dehydration.  Last Sunday he became ill, possibly with the flu.. The previous Thursday he had received his Flu and RSV vaccinations. He had a high fever and was shuffling while walking due to a loss of equilibrium. Today he is feeling better.   Last night he felt an episode of chest pressure when he lied down for bed. He notes that he usually sleeps on his stomach. He denies acid reflux (which he has experienced before), as the discomfort was more of a heaviness than a pain. Has no symptoms with exertion and is able to play pickleball without issue.  Lately he has noticed more appetite loss. He has not been eating everything that he prepares on the plate. Some days he may only have one true meal. He is not able to taste his food as well as he used to. Has not had  recent cancer screening.  For activity he enjoys playing pickleball. He has successfully lost about 5 lbs.  He is a former smoker. He started when he was 76 yo, and quit intermittently 3 years ago. At this time, he has not had a cigarette for 6 months.  He denies any palpitations, shortness of breath, or peripheral edema. No lightheadedness, headaches, syncope, orthopnea, or PND.   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[place])    ED (erectile dysfunction)    Gout    Hypertension    Melanoma ([place])    OSA (obstructive sleep apnea)    PAD (peripheral artery disease) (HCC)    Tobacco use     No past surgical history on file.  Current Medications: Current Meds  Medication Sig   ACCU-CHEK GUIDE test strip AS DIRECTED IN VITRO ONCE A DAY 90 DAYS   Accu-Chek Softclix Lancets lancets See admin instructions.   allopurinol (ZYLOPRIM) 300 MG tablet Take 300 mg by mouth daily.   amLODipine (NORVASC) 5 MG tablet Take 1 tablet (5 mg total) by mouth daily.   aspirin EC 81 MG tablet Take 81 mg by mouth daily. Swallow whole.   atenolol (TENORMIN) 50 MG tablet Take 50 mg by mouth daily.   BREO ELLIPTA 100-25 MCG/ACT AEPB TAKE  1 PUFF BY MOUTH EVERY DAY   cholecalciferol (VITAMIN D) 25 MCG (1000 UNIT) tablet Take 1,000 Units by mouth daily.   Evolocumab 140 MG/ML SOAJ Inject 1 mL into the skin every 14 (fourteen) days.   fenofibrate 160 MG tablet Take 160 mg by mouth daily.   fluticasone (FLONASE) 50 MCG/ACT nasal spray Place into both nostrils.   hydrochlorothiazide (MICROZIDE) 12.5 MG capsule Take 1 capsule (12.5 mg total) by mouth daily.   icosapent Ethyl (VASCEPA) 1 g capsule Take 2 capsules (2 g total) by mouth 2 (two) times daily.   indomethacin (INDOCIN) 25 MG capsule Take 25 mg by mouth 2 (two) times daily. Take 1 capsule twice daily with food or milk for 14 days   JARDIANCE 10 MG TABS tablet Take 10 mg by mouth  daily.   metFORMIN (GLUCOPHAGE) 1000 MG tablet Take 1,000 mg by mouth 2 (two) times daily.   valsartan (DIOVAN) 160 MG tablet Take 160 mg by mouth daily.   varenicline (CHANTIX) 1 MG tablet Take 1 mg by mouth daily.     Allergies:   Penicillins   Social History   Socioeconomic History   Marital status: Divorced    Spouse name: Not on file   Number of children: Not on file   Years of education: Not on file   Highest education level: Not on file  Occupational History   Not on file  Tobacco Use   Smoking status: Former    Packs/day: 2.00    Years: 50.00    Total pack years: 100.00    Types: Cigarettes   Smokeless tobacco: Never  Substance and Sexual Activity   Alcohol use: Not on file   Drug use: Not on file   Sexual activity: Not on file  Other Topics Concern   Not on file  Social History Narrative   Not on file   Social Determinants of Health   Financial Resource Strain: Not on file  Food Insecurity: Not on file  Transportation Needs: Not on file  Physical Activity: Not on file  Stress: Not on file  Social Connections: Not on file     Family History: The patient's family history includes Hodgkin's lymphoma in his father; Other in his mother.  ROS:   Please see the history of present illness.    Review of Systems  Constitutional:  Negative for chills and fever.  HENT:  Negative for congestion and nosebleeds.   Eyes:  Negative for blurred vision.  Respiratory:  Negative for shortness of breath.   Cardiovascular:  Positive for chest pain (More of a pressure/heaviness). Negative for palpitations, orthopnea, claudication, leg swelling and PND.  Gastrointestinal:  Negative for melena, nausea and vomiting.  Genitourinary:  Negative for hematuria.  Musculoskeletal:  Positive for joint pain and myalgias (Mostly resolved).  Skin:  Negative for itching and rash.  Neurological:  Negative for dizziness and headaches.  Endo/Heme/Allergies:  Does not bruise/bleed easily.   Psychiatric/Behavioral:  The patient is not nervous/anxious and does not have insomnia.     EKGs/Labs/Other Studies Reviewed:    The following studies were reviewed today:  LE ABI 01/30/22: Summary:  Right: Resting right ankle-brachial index is within normal range. No  evidence of significant right lower extremity arterial disease. The right  toe-brachial index is normal.   Left: Resting left ankle-brachial index is within normal range. No  evidence of significant left lower extremity arterial disease. The left  toe-brachial index is normal.   Chest CT 07/09/21 IMPRESSION:  1. Mild central bronchial wall thickening suggesting chronic bronchitis. Lungs are clear. 2. Diffuse hepatic steatosis. 3. Aortic atherosclerosis and coronary artery atherosclerosis (ICD10-I70.0).   Echo 01/24/2021 1. Left ventricular ejection fraction, by estimation, is 50 to 55%. The left ventricle has low normal function. The left ventricle has no regional wall motion abnormalities. Left ventricular diastolic parameters are indeterminate.   2. Right ventricular systolic function is normal. The right ventricular size is normal. There is normal pulmonary artery systolic pressure.   3. The mitral valve is grossly normal. Trivial mitral valve  regurgitation.   4. The aortic valve is tricuspid. There is mild calcification of the aortic valve. There is mild thickening of the aortic valve. Aortic valve regurgitation is not visualized.   Patient brought records which were reviewed. 2016: Left iliac artery stenting 2012: OM1 stent 2009: Myoview with inferior infarct but no ischemia 1999: RCA stent  EKG:    EKG is personally reviewed. 07/25/2022:   EKG was not ordered. 02/04/2022: EKG was not ordered. 01/28/22: Sinus bradycardia, rate 56 bpm 12/12/20: sinus bradycardia with HR 57; inferior q-waves  Recent Labs: 11/15/2021: Hemoglobin 16.7; Platelets 384 01/22/2022: ALT 36; BUN 25; Creatinine, Ser 1.75; Potassium 4.9;  Sodium 141   Recent Lipid Panel    Component Value Date/Time   CHOL 153 01/22/2022 0802   TRIG 320 (H) 01/22/2022 0802   HDL 33 (L) 01/22/2022 0802   CHOLHDL 4.6 01/22/2022 0802   LDLCALC 69 01/22/2022 0802      Physical Exam:    VS:  BP 122/74   Pulse (!) 54   Ht '5\' 10"'$  (1.778 m)   Wt 200 lb 3.2 oz (90.8 kg)   SpO2 97%   BMI 28.73 kg/m     Wt Readings from Last 3 Encounters:  07/25/22 200 lb 3.2 oz (90.8 kg)  02/25/22 207 lb (93.9 kg)  02/21/22 207 lb (93.9 kg)     GEN:  Well nourished, well developed in no acute distress HEENT: Normal NECK: No JVD; No carotid bruits CARDIAC: RRR, 1/6 systolic murmur. No rubs, gallops.  RESPIRATORY:  Clear to auscultation without rales, wheezing or rhonchi  ABDOMEN: Soft, non-tender, non-distended MUSCULOSKELETAL:  No edema; No deformity  SKIN: Warm and dry NEUROLOGIC:  Alert and oriented x 3 PSYCHIATRIC:  Normal affect    ASSESSMENT:    1. Coronary artery disease involving native coronary artery of native heart without angina pectoris   2. Essential hypertension   3. Pure hypercholesterolemia   4. Aortic atherosclerosis ([place])   5. Type 2 diabetes mellitus without complication, without long-term current use of insulin ([place])   6. PAD (peripheral artery disease) (HCC)     PLAN:    In order of problems listed above:  #PAD s/p stenting to left iliac artery: ABI 01/2022 normal bilaterally. Currently doing well with no claudication symptoms.  -Continue ASA '81mg'$  daily -Continue vascepa 2g BID -Continue fenofibrate '160mg'$  daily -Continue repatha '140mg'$  q 2 weeks -Continue healthy lifestyle with diet and exercise  #Multivessel CAD s/p remote history of PCI to RCA and OM1: Previously followed in [place] with history of PCI to RCA and OM1. TTE with LVEF 50-55%. No current anginal symptoms. -Continue ASA '81mg'$  daily -Continue repatha '140mg'$  q 2weeks -Continue atenolol '50mg'$  daily -Continue Valsartan '160mg'$  daily -Continue  vascepa 2g BID -Continue fenofibrate '160mg'$  daily  #HTN:. Controlled.  -Continue HCTZ 12.'5mg'$  daily -Continue valsartan '160mg'$  daily -Continue atenolol '50mg'$  daily -Low Na diet -Monitor blood pressures at home  #HLD: -Continue  vascepa 2g BID -Continue fenofibrate '160mg'$  daily -Continue repatha '140mg'$  q 2 weeks -LDL 69  #DMII: -Continue metformin  #Prior Tobacco Abuse: -Continue varenicline  Follow-up:  6 months.  Medication Adjustments/Labs and Tests Ordered: Current medicines are reviewed at length with the patient today.  Concerns regarding medicines are outlined above.   No orders of the defined types were placed in this encounter.  No orders of the defined types were placed in this encounter.  Patient Instructions  Medication Instructions:   Your physician recommends that you continue on your current medications as directed. Please refer to the Current Medication list given to you today.  *If you need a refill on your cardiac medications before your next appointment, please call your pharmacy*   Follow-Up: At [facility], you and your health needs are our priority.  As part of our continuing mission to provide you with exceptional heart care, we have created designated Provider Care Teams.  These Care Teams include your primary Cardiologist (physician) and Advanced Practice Providers (APPs -  Physician Assistants and Nurse Practitioners) who all work together to provide you with the care you need, when you need it.  We recommend signing up for the patient portal called "MyChart".  Sign up information is provided on this After Visit Summary.  MyChart is used to connect with patients for Virtual Visits (Telemedicine).  Patients are able to view lab/test results, encounter notes, upcoming appointments, etc.  Non-urgent messages can be sent to your provider as well.   To learn more about what you can do with MyChart, go to [email or web address].    Your next  appointment:   6 month(s)  The format for your next appointment:   In Person  Provider:   Freada Bergeron, MD      Important Information About Sugar        I,Mathew Stumpf,acting as a scribe for Freada Bergeron, MD.,have documented all relevant documentation on the behalf of Freada Bergeron, MD,as directed by  Freada Bergeron, MD while in the presence of Freada Bergeron, MD.  I, Freada Bergeron, MD, have reviewed all documentation for this visit. The documentation on 07/25/22 for the exam, diagnosis, procedures, and orders are all accurate and complete.   Signed, Freada Bergeron, MD  07/25/2022 10:23 AM    [place]

## 2022-07-25 ENCOUNTER — Encounter: Payer: Self-pay | Admitting: Cardiology

## 2022-07-25 ENCOUNTER — Ambulatory Visit: Payer: Medicare Other | Attending: Cardiology | Admitting: Cardiology

## 2022-07-25 VITALS — BP 122/74 | HR 54 | Ht 70.0 in | Wt 200.2 lb

## 2022-07-25 DIAGNOSIS — I10 Essential (primary) hypertension: Secondary | ICD-10-CM

## 2022-07-25 DIAGNOSIS — I2510 Atherosclerotic heart disease of native coronary artery without angina pectoris: Secondary | ICD-10-CM | POA: Diagnosis present

## 2022-07-25 DIAGNOSIS — I700 Atherosclerosis of aorta: Secondary | ICD-10-CM | POA: Diagnosis present

## 2022-07-25 DIAGNOSIS — I739 Peripheral vascular disease, unspecified: Secondary | ICD-10-CM | POA: Insufficient documentation

## 2022-07-25 DIAGNOSIS — E7800 Pure hypercholesterolemia, unspecified: Secondary | ICD-10-CM | POA: Insufficient documentation

## 2022-07-25 DIAGNOSIS — E119 Type 2 diabetes mellitus without complications: Secondary | ICD-10-CM | POA: Insufficient documentation

## 2022-07-25 MED ORDER — ATENOLOL 50 MG PO TABS: 50.0000 mg | ORAL_TABLET | Freq: Every day | ORAL | 3 refills | Status: DC

## 2022-07-25 MED ORDER — FENOFIBRATE 160 MG PO TABS: 160.0000 mg | ORAL_TABLET | Freq: Every day | ORAL | 3 refills | Status: AC

## 2022-07-25 MED ORDER — AMLODIPINE BESYLATE 5 MG PO TABS: 5.0000 mg | ORAL_TABLET | Freq: Every day | ORAL | 2 refills | Status: DC

## 2022-07-25 MED ORDER — HYDROCHLOROTHIAZIDE 12.5 MG PO CAPS: 12.5000 mg | ORAL_CAPSULE | Freq: Every day | ORAL | 3 refills | Status: DC

## 2022-07-25 MED ORDER — VALSARTAN 160 MG PO TABS: 160.0000 mg | ORAL_TABLET | Freq: Every day | ORAL | 2 refills | Status: DC

## 2022-07-25 NOTE — Patient Instructions (Signed)
Medication Instructions:   Your physician recommends that you continue on your current medications as directed. Please refer to the Current Medication list given to you today.  *If you need a refill on your cardiac medications before your next appointment, please call your pharmacy*   Follow-Up: At [facility], you and your health needs are our priority.  As part of our continuing mission to provide you with exceptional heart care, we have created designated Provider Care Teams.  These Care Teams include your primary Cardiologist (physician) and Advanced Practice Providers (APPs -  Physician Assistants and Nurse Practitioners) who all work together to provide you with the care you need, when you need it.  We recommend signing up for the patient portal called "MyChart".  Sign up information is provided on this After Visit Summary.  MyChart is used to connect with patients for Virtual Visits (Telemedicine).  Patients are able to view lab/test results, encounter notes, upcoming appointments, etc.  Non-urgent messages can be sent to your provider as well.   To learn more about what you can do with MyChart, go to [email or web address].    Your next appointment:   6 month(s)  The format for your next appointment:   In Person  Provider:   Heather E Pemberton, MD     Important Information About Sugar       

## 2022-11-19 ENCOUNTER — Encounter: Payer: Self-pay | Admitting: Pulmonary Disease

## 2022-11-19 ENCOUNTER — Ambulatory Visit (INDEPENDENT_AMBULATORY_CARE_PROVIDER_SITE_OTHER): Payer: Medicare Other | Admitting: Pulmonary Disease

## 2022-11-19 VITALS — BP 120/80 | HR 57 | Ht 70.5 in | Wt 205.4 lb

## 2022-11-19 DIAGNOSIS — R053 Chronic cough: Secondary | ICD-10-CM | POA: Diagnosis not present

## 2022-11-19 NOTE — Patient Instructions (Addendum)
Nice to see you again  For the neck step, I recommend sinus rinses.  You can obtain the bottle in the salt packets on the cold and flu aisle.  Mix with distilled or purified water.  For 1 week use sinus rinses twice a day then decrease to once daily  After each sinus rinse, use Flonase 1 spray each nostril twice a day for 1 week then decrease to once daily  Use Afrin 2 sprays each nostril twice a day for 3 days then stop  Make sure you use the Flonase and Afrin after the sinus rinses, otherwise sinus rinse will wash out all the medication  Please send me a message in about 2 weeks on me know how the cough and runny nose are doing.  If no minimal improvement we will go to Plan B.  Return to like in 3 months or sooner as needed with Dr. Silas Flood

## 2022-11-19 NOTE — Progress Notes (Unsigned)
$'@Patient'g$  ID: Kristopher Rowe, male    DOB: January 24, 1946, 77 y.o.   MRN: [id number]  Chief Complaint  Patient presents with   Follow-up    Occ cough, pt stop using inhaler. Didn't see much difference     Referring provider: [name], MD  HPI:   77 y.o. whom we are seeing for evaluation of chronic cough. Cardiology note x2 reviewed.  Patient last seen 07/2021.  Requested 42-monthfollow-up.  He admits he did not follow-up.  Telephone encounter 03/2022 requesting follow-up.  Not scheduled.  Returns with ongoing cough.  Trial of Breo he says for about 7 months.  No improvement in cough.  No worsening either.  No real change.  He has had this cough for many years.  He does share he has had significant rhinorrhea even longer that predated the cough.  Usually clears up in the evenings.  Does not think it drips in the back of his throat.  But the rhinorrhea quickly returns in the morning when he is no longer supine.  He denies any GERD symptoms today.  No significant breakthrough heartburn etc.  Cough is intermittently productive.  Largely dry.  Seems worse in the evenings in terms of frequency and severity.  But can occur throughout the day.  Reviewed CT scan 06/2021 that reveals thickened bronchioles, otherwise clear lungs.  HPI at initial visit:  Notes cough on a daily basis for several years.  More often than not productive.  Sometimes dry.  No clear time of day when things are better or worse.  No seasonal environmental changes make things better or worse.  He denies atopic symptoms.  No position to make things better or worse.  No real alleviating or exacerbating factors.  He has a history of GERD.  No significant over 100-pack-year smoking history.  CT chest 07/09/2021 reviewed and interpreted as clear lungs bilaterally, thickened bronchioles throughout on my interpretation.  Recent sleep study 02/07/2021 reviewed with moderate obstructive sleep apnea.   PMH: CAD status post PCI,  diabetes Surgical history:No past surgical history on file. Family history: Denies significant respiratory illness with friends or relatives Social history: Former smoker, 100+ pack year, [place]/ Pulmonary Flowsheets:   ACT:      No data to display          MMRC:     No data to display          Epworth:      No data to display          Tests:   FENO:  No results found for: "NITRICOXIDE"  PFT:     No data to display          WALK:      No data to display          Imaging: Personally reviewed and as per EMR and discussion of this note No results found.  Lab Results:  CBC    Component Value Date/Time   WBC 11.6 (H) 11/15/2021 2308   RBC 5.14 11/15/2021 2308   HGB 16.7 11/15/2021 2308   HCT 49.0 11/15/2021 2308   PLT 384 11/15/2021 2308   MCV 95.3 11/15/2021 2308   MCH 32.5 11/15/2021 2308   MCHC 34.1 11/15/2021 2308   RDW 13.0 11/15/2021 2308   LYMPHSABS 2.1 11/15/2021 2308   MONOABS 0.6 11/15/2021 2308   EOSABS 0.1 11/15/2021 2308   BASOSABS 0.0 11/15/2021 2308    BMET  Component Value Date/Time   NA 141 01/22/2022 0802   K 4.9 01/22/2022 0802   CL 101 01/22/2022 0802   CO2 24 01/22/2022 0802   GLUCOSE 157 (H) 01/22/2022 0802   GLUCOSE 186 (H) 11/15/2021 2308   BUN 25 01/22/2022 0802   CREATININE 1.75 (H) 01/22/2022 0802   CALCIUM 11.3 (H) 01/22/2022 0802   GFRNONAA 43 (L) 11/15/2021 2308    BNP No results found for: "BNP"  ProBNP No results found for: "PROBNP"  Specialty Problems       Pulmonary Problems   Snoring   Witnessed episode of apnea   Obstructive sleep apnea syndrome   Seasonal allergic rhinitis    Allergies  Allergen Reactions   Penicillins      There is no immunization history on file for this patient.  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[facility])    ED  (erectile dysfunction)    Gout    Hypertension    Melanoma ([facility])    OSA (obstructive sleep apnea)    PAD (peripheral artery disease) ([facility])    Tobacco use     Tobacco History: Social History   Tobacco Use  Smoking Status Former   Packs/day: 2.00   Years: 50.00   Total pack years: 100.00   Types: Cigarettes  Smokeless Tobacco Never   Counseling given: Not Answered   Continue to not smoke  Outpatient Encounter Medications as of 11/19/2022  Medication Sig   ACCU-CHEK GUIDE test strip AS DIRECTED IN VITRO ONCE A DAY 90 DAYS   Accu-Chek Softclix Lancets lancets See admin instructions.   allopurinol (ZYLOPRIM) 300 MG tablet Take 300 mg by mouth daily.   amLODipine (NORVASC) 5 MG tablet Take 1 tablet (5 mg total) by mouth daily.   aspirin EC 81 MG tablet Take 81 mg by mouth daily. Swallow whole.   atenolol (TENORMIN) 50 MG tablet Take 1 tablet (50 mg total) by mouth daily.   cholecalciferol (VITAMIN D) 25 MCG (1000 UNIT) tablet Take 1,000 Units by mouth daily.   Evolocumab 140 MG/ML SOAJ Inject 1 mL into the skin every 14 (fourteen) days.   fenofibrate 160 MG tablet Take 1 tablet (160 mg total) by mouth daily.   fluticasone (FLONASE) 50 MCG/ACT nasal spray Place into both nostrils.   hydrochlorothiazide (MICROZIDE) 12.5 MG capsule Take 1 capsule (12.5 mg total) by mouth daily.   icosapent Ethyl (VASCEPA) 1 g capsule Take 2 capsules (2 g total) by mouth 2 (two) times daily.   indomethacin (INDOCIN) 25 MG capsule Take 25 mg by mouth 2 (two) times daily. Take 1 capsule twice daily with food or milk for 14 days   JARDIANCE 10 MG TABS tablet Take 10 mg by mouth daily.   metFORMIN (GLUCOPHAGE) 1000 MG tablet Take 1,000 mg by mouth 2 (two) times daily.   valsartan (DIOVAN) 160 MG tablet Take 1 tablet (160 mg total) by mouth daily.   varenicline (CHANTIX) 1 MG tablet Take 1 mg by mouth daily.   BREO ELLIPTA 100-25 MCG/ACT AEPB TAKE 1 PUFF BY MOUTH EVERY DAY (Patient not taking: Reported on  11/19/2022)   No facility-administered encounter medications on file as of 11/19/2022.     Review of Systems  Review of Systems  N/a Physical Exam  BP 120/80 (BP Location: Left Arm, Patient Position: Sitting, Cuff Size: Normal)   Pulse (!) 57   Ht 5' 10.5" (1.791 m)  Wt 205 lb 6.4 oz (93.2 kg)   SpO2 98%   BMI 29.06 kg/m   Wt Readings from Last 5 Encounters:  11/19/22 205 lb 6.4 oz (93.2 kg)  07/25/22 200 lb 3.2 oz (90.8 kg)  02/25/22 207 lb (93.9 kg)  02/21/22 207 lb (93.9 kg)  02/10/22 209 lb (94.8 kg)    BMI Readings from Last 5 Encounters:  11/19/22 29.06 kg/m  07/25/22 28.73 kg/m  02/25/22 29.70 kg/m  02/21/22 29.70 kg/m  02/10/22 29.99 kg/m     Physical Exam General: Sitting in chair, no acute distress Eyes: EOMI, no icterus Neck: Supple, no JVP Pulmonary: Clear, normal work of breathing Cardiovascular: Warm, no edema noted Abdomen: Nondistended, bowel sounds present MSK: No synovitis, no joint effusion Neuro: Normal gait, no weakness Psych: Normal mood, full affect  Assessment & Plan:   Chronic cough: Predominantly productive.  Near daily.  Present for over 2 years.  Clinically, chronic bronchitis.  Likely related to prior cigarette smoking.   CT scan 06/2021 consistent with bronchial wall thickening.  In addition, he describes chronic nonallergic rhinorrhea.  High suspicion for postnasal drip.  Denies significant GERD symptoms. -- Trial of Breo 2022 for months without improvement --Start nasal regimen sinus rinses twice daily 7 days, Flonase twice daily for 7 days, Afrin twice daily x 3 days, then sinus rinses daily, Flonase daily thereafter    Return in about 3 months (around 02/18/2023).   Lanier Clam, MD 11/19/2022   I spent 45 minutes in the care of the patient including face-to-face visit, review of records, coordination of care

## 2022-11-20 ENCOUNTER — Other Ambulatory Visit (HOSPITAL_COMMUNITY): Payer: Self-pay

## 2022-11-20 ENCOUNTER — Encounter: Payer: Self-pay | Admitting: Pulmonary Disease

## 2022-11-23 ENCOUNTER — Encounter: Payer: Self-pay | Admitting: Pulmonary Disease

## 2022-12-01 ENCOUNTER — Other Ambulatory Visit (HOSPITAL_COMMUNITY): Payer: Self-pay

## 2022-12-01 ENCOUNTER — Telehealth: Payer: Self-pay | Admitting: Pharmacist

## 2022-12-01 ENCOUNTER — Encounter: Payer: Self-pay | Admitting: Cardiology

## 2022-12-01 MED ORDER — VASCEPA 1 G PO CAPS: 2.0000 g | ORAL_CAPSULE | Freq: Two times a day (BID) | ORAL | 11 refills | Status: AC

## 2022-12-01 NOTE — Telephone Encounter (Signed)
Test claim shows that insurance prefers brand vascepa. Coapy $45/month.

## 2022-12-11 HISTORY — PX: OTHER SURGICAL HISTORY: SHX169

## 2022-12-15 ENCOUNTER — Other Ambulatory Visit: Payer: Self-pay | Admitting: Family Medicine

## 2022-12-15 DIAGNOSIS — G3184 Mild cognitive impairment, so stated: Secondary | ICD-10-CM

## 2022-12-16 ENCOUNTER — Ambulatory Visit
Admission: RE | Admit: 2022-12-16 | Discharge: 2022-12-16 | Disposition: A | Discharge status: Home / Self Care | Payer: Medicare Other | Source: Ambulatory Visit | Attending: Family Medicine | Admitting: Family Medicine

## 2022-12-16 DIAGNOSIS — G3184 Mild cognitive impairment, so stated: Secondary | ICD-10-CM

## 2023-01-20 ENCOUNTER — Encounter: Payer: Self-pay | Admitting: Neurology

## 2023-01-20 ENCOUNTER — Ambulatory Visit (INDEPENDENT_AMBULATORY_CARE_PROVIDER_SITE_OTHER): Payer: Medicare Other | Admitting: Neurology

## 2023-01-20 VITALS — BP 118/70 | HR 60 | Ht 70.6 in | Wt 206.5 lb

## 2023-01-20 DIAGNOSIS — G3184 Mild cognitive impairment, so stated: Secondary | ICD-10-CM

## 2023-01-20 NOTE — Patient Instructions (Signed)
ATN profile to look for Alzheimer's biomarker Follow-up in 1 year

## 2023-01-20 NOTE — Progress Notes (Signed)
[facility]  PATIENT: Kristopher Rowe DOB: 1945-12-14  REQUESTING CLINICIAN: Scott-Lowe, Sherlyn Hay, MD HISTORY FROM: Patient  REASON FOR VISIT: Memory complaints    HISTORICAL  CHIEF COMPLAINT:  Chief Complaint  Patient presents with   New Patient (Initial Visit)    Rm 13. Alone. NP/[facility]/Janine Jason Fila MD/memory loss.    HISTORY OF PRESENT ILLNESS:  This is a 77 year old gentleman with past medical history including hypertension, hyperlipidemia, diabetes mellitus, who is presenting with memory problem.  Patient reports initially having an event 15 years ago.  He was at work and could not remember the name of his old friend, someone that he knows for many years and used to play pickle ball with him.  Due to the initial concern he presented to his neurologist, had full memory/cognition workup and it was negative.  He reported last year he started having difficulty remembering names.  Again these are not names of family members, but  names of people that he knows, name of famous people.  On top of that, he feels also that his short-term memory is getting worse.  He does live alone, is independent in all ADLs and IADLs.  He does drive, denies any recent accident, stating occasionally he has  words finding difficulty.  He does exercise and read a lot, he is a avid reader.    TBI:   No past history of TBI Stroke:   no past history of stroke Seizures:   no past history of seizures Sleep:   Has sleep apnea but not using his CPAP machine  Mood:  patient denies anxiety and depression Family history of Dementia:  Denies  Functional status: independent in all ADLs and IADLs Patient lives alone. Cooking: yes Cleaning: yes Shopping: yes Bathing: yes Toileting: yes Driving: yes Bills: yes  Ever left the stove on by accident?: Denies  Forget how to use items around the house?: Denies  Getting lost going to familiar places?: Denies  Forgetting  loved ones names?: Denies  Word finding difficulty? occasional  Sleep: Not good, has sleep apnea an restlesness    OTHER MEDICAL CONDITIONS: CAD, Hypertension, Hyperlipidemia, Diabetes   REVIEW OF SYSTEMS: Full 14 system review of systems performed and negative with exception of: As noted in the HPI   ALLERGIES: Allergies  Allergen Reactions   Other Hives    Allergic to a medication that begins with a "P"   Penicillins     HOME MEDICATIONS: Outpatient Medications Prior to Visit  Medication Sig Dispense Refill   ACCU-CHEK GUIDE test strip AS DIRECTED IN VITRO ONCE A DAY 90 DAYS     Accu-Chek Softclix Lancets lancets See admin instructions.     allopurinol (ZYLOPRIM) 300 MG tablet Take 300 mg by mouth daily.     amLODipine (NORVASC) 5 MG tablet Take 1 tablet (5 mg total) by mouth daily. 90 tablet 2   aspirin EC 81 MG tablet Take 81 mg by mouth daily. Swallow whole.     atenolol (TENORMIN) 50 MG tablet Take 1 tablet (50 mg total) by mouth daily. 90 tablet 3   BREO ELLIPTA 100-25 MCG/ACT AEPB TAKE 1 PUFF BY MOUTH EVERY DAY 60 each 3   cholecalciferol (VITAMIN D) 25 MCG (1000 UNIT) tablet Take 1,000 Units by mouth daily.     Evolocumab 140 MG/ML SOAJ Inject 1 mL into the skin every 14 (fourteen) days. 6 mL 3   fenofibrate 160 MG tablet Take 1 tablet (160 mg total) by  mouth daily. 90 tablet 3   fluticasone (FLONASE) 50 MCG/ACT nasal spray Place into both nostrils.     hydrochlorothiazide (MICROZIDE) 12.5 MG capsule Take 1 capsule (12.5 mg total) by mouth daily. 90 capsule 3   indomethacin (INDOCIN) 25 MG capsule Take 25 mg by mouth 2 (two) times daily. Take 1 capsule twice daily with food or milk for 14 days     JARDIANCE 10 MG TABS tablet Take 10 mg by mouth daily.     metFORMIN (GLUCOPHAGE) 1000 MG tablet Take 1,000 mg by mouth 2 (two) times daily.     valsartan (DIOVAN) 160 MG tablet Take 1 tablet (160 mg total) by mouth daily. 90 tablet 2   varenicline (CHANTIX) 1 MG tablet Take 1  mg by mouth daily.     VASCEPA 1 g capsule Take 2 capsules (2 g total) by mouth 2 (two) times daily. 120 capsule 11   No facility-administered medications prior to visit.    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[facility])    ED (erectile dysfunction)    Gout    Hypertension    Melanoma ([place])    OSA (obstructive sleep apnea)    PAD (peripheral artery disease) ([facility])    Tobacco use     PAST SURGICAL HISTORY: Past Surgical History:  Procedure Laterality Date   post removal (oral sugery)  12/2022    FAMILY HISTORY: Family History  Problem Relation Age of Onset   Other Mother        RESPIRATORY INFECTION   Hodgkin's lymphoma Father     SOCIAL HISTORY: Social History   Socioeconomic History   Marital status: Divorced    Spouse name: Not on file   Number of children: Not on file   Years of education: Not on file   Highest education level: Not on file  Occupational History   Not on file  Tobacco Use   Smoking status: Former    Packs/day: 2.00    Years: 50.00    Total pack years: 100.00    Types: Cigarettes   Smokeless tobacco: Never  Substance and Sexual Activity   Alcohol use: Not on file   Drug use: Not on file   Sexual activity: Not on file  Other Topics Concern   Not on file  Social History Narrative   Not on file   Social Determinants of Health   Financial Resource Strain: Not on file  Food Insecurity: Not on file  Transportation Needs: Not on file  Physical Activity: Not on file  Stress: Not on file  Social Connections: Not on file  Intimate Partner Violence: Not on file    PHYSICAL EXAM  GENERAL EXAM/CONSTITUTIONAL: Vitals:  Vitals:   01/20/23 1049  BP: 118/70  Pulse: 60  Weight: 206 lb 8 oz (93.7 kg)  Height: 5' 10.6" (1.793 m)   Body mass index is 29.13 kg/m. Wt Readings from Last 3 Encounters:  01/20/23 206 lb 8 oz (93.7 kg)  11/19/22  205 lb 6.4 oz (93.2 kg)  07/25/22 200 lb 3.2 oz (90.8 kg)   Patient is in no distress; well developed, nourished and groomed; neck is supple   EYES: Visual fields full to confrontation, Extraocular movements intacts,   MUSCULOSKELETAL: Gait, strength, tone, movements noted in Neurologic exam below  NEUROLOGIC: MENTAL STATUS:      No data to display  awake, alert, oriented to person, place and time recent and remote memory intact normal attention and concentration language fluent, comprehension intact, naming intact fund of knowledge appropriate     01/20/2023   10:51 AM  Montreal Cognitive Assessment   Visuospatial/ Executive (0/5) 5  Naming (0/3) 3  Attention: Read list of digits (0/2) 2  Attention: Read list of letters (0/1) 1  Attention: Serial 7 subtraction starting at 100 (0/3) 3  Language: Repeat phrase (0/2) 2  Language : Fluency (0/1) 1  Abstraction (0/2) 2  Delayed Recall (0/5) 5  Orientation (0/6) 6  Total 30    CRANIAL NERVE:  2nd, 3rd, 4th, 6th- visual fields full to confrontation, extraocular muscles intact, no nystagmus 5th - facial sensation symmetric 7th - facial strength symmetric 8th - hearing intact 9th - palate elevates symmetrically, uvula midline 11th - shoulder shrug symmetric 12th - tongue protrusion midline  MOTOR:  normal bulk and tone, full strength in the BUE, BLE  SENSORY:  normal and symmetric to light touch  COORDINATION:  finger-nose-finger, fine finger movements normal  GAIT/STATION:  normal   DIAGNOSTIC DATA (LABS, IMAGING, TESTING) - I reviewed patient records, labs, notes, testing and imaging myself where available.  Lab Results  Component Value Date   WBC 11.6 (H) 11/15/2021   HGB 16.7 11/15/2021   HCT 49.0 11/15/2021   MCV 95.3 11/15/2021   PLT 384 11/15/2021      Component Value Date/Time   NA 141 01/22/2022 0802   K 4.9 01/22/2022 0802   CL 101 01/22/2022 0802   CO2 24 01/22/2022 0802    GLUCOSE 157 (H) 01/22/2022 0802   GLUCOSE 186 (H) 11/15/2021 2308   BUN 25 01/22/2022 0802   CREATININE 1.75 (H) 01/22/2022 0802   CALCIUM 11.3 (H) 01/22/2022 0802   PROT 7.2 01/22/2022 0802   ALBUMIN 5.0 (H) 01/22/2022 0802   AST 34 01/22/2022 0802   ALT 36 01/22/2022 0802   ALKPHOS 54 01/22/2022 0802   BILITOT 0.5 01/22/2022 0802   GFRNONAA 43 (L) 11/15/2021 2308   Lab Results  Component Value Date   CHOL 153 01/22/2022   HDL 33 (L) 01/22/2022   LDLCALC 69 01/22/2022   TRIG 320 (H) 01/22/2022   CHOLHDL 4.6 01/22/2022   No results found for: "HGBA1C" No results found for: "VITAMINB12" No results found for: "TSH"  MRI Brain 12/16/2022 1. No acute intracranial abnormality. 2. Mild chronic microvascular ischemic changes of the white matter. 3. Moderate parenchymal volume loss.    ASSESSMENT AND PLAN  77 y.o. year old male with hypertension, hyperlipidemia, diabetes mellitus who is presenting with memory complaints mainly forgetting names and short-term memory. His neurological examination was normal.  His MoCA score was normal at 30 out of 30.  I informed patient that most likely this is in the spectrum of normal cognition to mild cognitive impairment.  At this point we will move forward and get the ATN profile to look for Alzheimer biomarker.  If this is negative, then we can inform patient that likely this is not early onset dementia.  He is very active, he is an avid reader and also exercise.  I encouraged him to continue doing the same.  I will see him in 1 year for follow-up.   1. Mild cognitive impairment      Patient Instructions  ATN profile to look for Alzheimer's biomarker Follow-up in 1 year  Orders Placed This Encounter  Procedures   Vitamin B12   TSH  ATN PROFILE    No orders of the defined types were placed in this encounter.   Return in about 1 year (around 01/20/2024).  I have spent a total of 60 minutes dedicated to this patient today, preparing  to see patient, performing a medically appropriate examination and evaluation, ordering tests and/or medications and procedures, and counseling and educating the patient/family/caregiver; independently interpreting result and communicating results to the family/patient/caregiver; and documenting clinical information in the electronic medical record.   Alric Ran, MD 01/20/2023, 1:17 PM  [facility] [address] [place], [place] [id number] [phone]

## 2023-01-24 NOTE — Progress Notes (Unsigned)
Cardiology Office Note:    Date:  01/28/2023   ID:  Kristopher Rowe, DOB Jun 03, 1946, MRN [id number]  PCP:  Gerarda Fraction, MD  [facility] Cardiologist:  Freada Bergeron, MD  [facility] Electrophysiologist:  None   Referring MD: [name], MD    History of Present Illness:    Kristopher Rowe is a 77 y.o. male with a hx of CAD s/p remote history of PCI to RCA and OM1, DMII, HTN, PAD s/p PTA and stent of left common iliac (2016), prior tobacco abuse, OSA, and gout who presents to clinic for follow-up.  Patient moved to [place] from [place] and established care in 12/2020 . Has extensive coronary history with history of RCA stent and OM1 in 2012. Also has left common iliac stenting in 2016 for severe claudication.  TTE with EF 50-55, no RWMA, normal RVSF, normal PASP, trivial MR.   Was seen on 01/28/22 where he was doing well with no anginal or claudication symptoms.   Was seen back on 02/04/22 due to significant leg pain in his quad area. We obtained vascular ABI which were normal bilaterally.   Was last seen in clinic on 07/2022 where he was stable from a CV standpoint.   Today, the patient overall feels well. No chest pain, SOB, orthopnea, LE edema, or PND. Tolerating medications as prescribed. Blood pressures are well controlled.    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[place])    ED (erectile dysfunction)    Gout    Hypertension    Melanoma ([place])    OSA (obstructive sleep apnea)    PAD (peripheral artery disease) ([facility])    Tobacco use     Past Surgical History:  Procedure Laterality Date   post removal (oral sugery)  12/2022    Current Medications: Current Meds  Medication Sig   ACCU-CHEK GUIDE test strip AS DIRECTED IN VITRO ONCE A DAY 90 DAYS   Accu-Chek Softclix Lancets lancets See admin instructions.   allopurinol (ZYLOPRIM) 300 MG tablet Take 300 mg by mouth daily.    amLODipine (NORVASC) 5 MG tablet Take 1 tablet (5 mg total) by mouth daily.   aspirin EC 81 MG tablet Take 81 mg by mouth daily. Swallow whole.   atenolol (TENORMIN) 50 MG tablet Take 1 tablet (50 mg total) by mouth daily.   BREO ELLIPTA 100-25 MCG/ACT AEPB TAKE 1 PUFF BY MOUTH EVERY DAY   cholecalciferol (VITAMIN D) 25 MCG (1000 UNIT) tablet Take 1,000 Units by mouth daily.   Evolocumab 140 MG/ML SOAJ Inject 1 mL into the skin every 14 (fourteen) days.   fenofibrate 160 MG tablet Take 1 tablet (160 mg total) by mouth daily.   fluticasone (FLONASE) 50 MCG/ACT nasal spray Place into both nostrils.   hydrochlorothiazide (MICROZIDE) 12.5 MG capsule Take 1 capsule (12.5 mg total) by mouth daily.   indomethacin (INDOCIN) 25 MG capsule Take 25 mg by mouth 2 (two) times daily. Take 1 capsule twice daily with food or milk for 14 days   JARDIANCE 10 MG TABS tablet Take 10 mg by mouth daily.   metFORMIN (GLUCOPHAGE) 1000 MG tablet Take 1,000 mg by mouth 2 (two) times daily.   valsartan (DIOVAN) 160 MG tablet Take 1 tablet (160 mg total) by mouth daily.   varenicline (CHANTIX) 1 MG tablet Take 1 mg by mouth daily.   VASCEPA 1 g capsule Take 2 capsules (2 g  total) by mouth 2 (two) times daily.     Allergies:   Other and Penicillins   Social History   Socioeconomic History   Marital status: Divorced    Spouse name: Not on file   Number of children: Not on file   Years of education: Not on file   Highest education level: Not on file  Occupational History   Not on file  Tobacco Use   Smoking status: Former    Packs/day: 2.00    Years: 50.00    Additional pack years: 0.00    Total pack years: 100.00    Types: Cigarettes   Smokeless tobacco: Never  Substance and Sexual Activity   Alcohol use: Not on file   Drug use: Not on file   Sexual activity: Not on file  Other Topics Concern   Not on file  Social History Narrative   Not on file   Social Determinants of Health   Financial Resource  Strain: Not on file  Food Insecurity: Not on file  Transportation Needs: Not on file  Physical Activity: Not on file  Stress: Not on file  Social Connections: Not on file     Family History: The patient's family history includes Hodgkin's lymphoma in his father; Other in his mother.  ROS:   Please see the history of present illness.    Review of Systems  Constitutional:  Negative for chills and fever.  HENT:  Negative for congestion and nosebleeds.   Respiratory:  Negative for shortness of breath.   Cardiovascular:  Negative for chest pain, palpitations, orthopnea, claudication, leg swelling and PND.  Gastrointestinal:  Negative for melena, nausea and vomiting.  Genitourinary:  Negative for hematuria.  Musculoskeletal:  Positive for joint pain and myalgias (Mostly resolved).  Skin:  Negative for itching and rash.  Neurological:  Negative for dizziness and headaches.  Psychiatric/Behavioral:  The patient is not nervous/anxious and does not have insomnia.     EKGs/Labs/Other Studies Reviewed:    The following studies were reviewed today:  LE ABI 01/30/22: Summary:  Right: Resting right ankle-brachial index is within normal range. No  evidence of significant right lower extremity arterial disease. The right  toe-brachial index is normal.   Left: Resting left ankle-brachial index is within normal range. No  evidence of significant left lower extremity arterial disease. The left  toe-brachial index is normal.   Chest CT 07/09/21 IMPRESSION: 1. Mild central bronchial wall thickening suggesting chronic bronchitis. Lungs are clear. 2. Diffuse hepatic steatosis. 3. Aortic atherosclerosis and coronary artery atherosclerosis (ICD10-I70.0).   Echo 01/24/2021 1. Left ventricular ejection fraction, by estimation, is 50 to 55%. The left ventricle has low normal function. The left ventricle has no regional wall motion abnormalities. Left ventricular diastolic parameters are  indeterminate.   2. Right ventricular systolic function is normal. The right ventricular size is normal. There is normal pulmonary artery systolic pressure.   3. The mitral valve is grossly normal. Trivial mitral valve  regurgitation.   4. The aortic valve is tricuspid. There is mild calcification of the aortic valve. There is mild thickening of the aortic valve. Aortic valve regurgitation is not visualized.   Patient brought records which were reviewed. 2016: Left iliac artery stenting 2012: OM1 stent 2009: Myoview with inferior infarct but no ischemia 1999: RCA stent  EKG:    EKG is personally reviewed. SB with HR 53bpm  Recent Labs: 01/20/2023: TSH 1.300   Recent Lipid Panel    Component Value Date/Time  CHOL 153 01/22/2022 0802   TRIG 320 (H) 01/22/2022 0802   HDL 33 (L) 01/22/2022 0802   CHOLHDL 4.6 01/22/2022 0802   LDLCALC 69 01/22/2022 0802      Physical Exam:    VS:  BP 124/74   Pulse (!) 53   Ht 5' 10.5" (1.791 m)   Wt 205 lb 6.4 oz (93.2 kg)   SpO2 97%   BMI 29.06 kg/m     Wt Readings from Last 3 Encounters:  01/28/23 205 lb 6.4 oz (93.2 kg)  01/20/23 206 lb 8 oz (93.7 kg)  11/19/22 205 lb 6.4 oz (93.2 kg)     GEN:  Well nourished, well developed in no acute distress HEENT: Normal NECK: No JVD; No carotid bruits CARDIAC: Bradycardic, regular, 1/6 systolic murmur RESPIRATORY:  Clear to auscultation without rales, wheezing or rhonchi  ABDOMEN: Soft, non-tender, non-distended MUSCULOSKELETAL:  No edema; No deformity  SKIN: Warm and dry NEUROLOGIC:  Alert and oriented x 3 PSYCHIATRIC:  Normal affect    ASSESSMENT:    1. Coronary artery disease involving native coronary artery of native heart without angina pectoris   2. Essential hypertension   3. Type 2 diabetes mellitus without complication, without long-term current use of insulin ([place])   4. PAD (peripheral artery disease) ([place])   5. Pure hypercholesterolemia   6. Primary hypertension   7.  Aortic atherosclerosis ([place])   8. Medication management      PLAN:    In order of problems listed above:  #PAD s/p stenting to left iliac artery: ABI 01/2022 normal bilaterally. Currently doing well with no claudication symptoms.  -Continue ASA 81mg  daily -Continue vascepa 2g BID -Continue fenofibrate 160mg  daily -Continue repatha 140mg  q 2 weeks -Continue healthy lifestyle with diet and exercise  #Multivessel CAD s/p remote history of PCI to RCA and OM1: Previously followed in [place] with history of PCI to RCA and OM1. TTE with LVEF 50-55%. No current anginal symptoms. -Continue ASA 81mg  daily -Continue repatha 140mg  q 2weeks -Continue atenolol 50mg  daily -Continue Valsartan 160mg  daily -Continue vascepa 2g BID -Continue fenofibrate 160mg  daily  #HTN:. Controlled and at goal <130/90 -Continue HCTZ 12.5mg  daily -Continue valsartan 160mg  daily -Continue atenolol 50mg  daily -Low Na diet -Monitor blood pressures at home  #HLD: #Aortic Atherosclerosis: -Continue vascepa 2g BID -Continue fenofibrate 160mg  daily -Continue repatha 140mg  q 2 weeks -Check cholesterol  #DMII: -Continue metformin and jardiance   #Prior Tobacco Abuse: -Continue varenicline  Follow-up:  6 months.  Medication Adjustments/Labs and Tests Ordered: Current medicines are reviewed at length with the patient today.  Concerns regarding medicines are outlined above.   Orders Placed This Encounter  Procedures   Lipid Profile   EKG 12-Lead   No orders of the defined types were placed in this encounter.  Patient Instructions  Medication Instructions:   Your physician recommends that you continue on your current medications as directed. Please refer to the Current Medication list given to you today.  *If you need a refill on your cardiac medications before your next appointment, please call your pharmacy*   Lab Work:  TODAY--LIPIDS  If you have labs (blood work) drawn today and your  tests are completely normal, you will receive your results only by: [place] (if you have MyChart) OR A paper copy in the mail If you have any lab test that is abnormal or we need to change your treatment, we will call you to review the results.    Follow-Up: At [facility], you and your health needs are our priority.  As part of our continuing mission to provide you with exceptional heart care, we have created designated Provider Care Teams.  These Care Teams include your primary Cardiologist (physician) and Advanced Practice Providers (APPs -  Physician Assistants and Nurse Practitioners) who all work together to provide you with the care you need, when you need it.  We recommend signing up for the patient portal called "MyChart".  Sign up information is provided on this After Visit Summary.  MyChart is used to connect with patients for Virtual Visits (Telemedicine).  Patients are able to view lab/test results, encounter notes, upcoming appointments, etc.  Non-urgent messages can be sent to your provider as well.   To learn more about what you can do with MyChart, go to [email or web address].    Your next appointment:   1 year(s)  Provider:   Freada Bergeron, MD          Signed, Freada Bergeron, MD  01/28/2023 3:24 PM    [place]

## 2023-01-26 LAB — ATN PROFILE
A -- Beta-amyloid 42/40 Ratio: 0.122 (ref >0.102)
Beta-amyloid 40: 127.56 pg/mL
Beta-amyloid 42: 15.57 pg/mL
N -- NfL, Plasma: 3.62 pg/mL (ref 0.00–7.64)
T -- p-tau181: 0.93 pg/mL (ref 0.00–0.97)

## 2023-01-26 LAB — TSH: TSH: 1.3 u[IU]/mL (ref 0.450–4.500)

## 2023-01-26 LAB — VITAMIN B12: Vitamin B-12: 472 pg/mL (ref 232–1245)

## 2023-01-28 ENCOUNTER — Ambulatory Visit: Payer: Medicare Other | Attending: Cardiology | Admitting: Cardiology

## 2023-01-28 ENCOUNTER — Encounter: Payer: Self-pay | Admitting: Cardiology

## 2023-01-28 VITALS — BP 124/74 | HR 53 | Ht 70.5 in | Wt 205.4 lb

## 2023-01-28 DIAGNOSIS — E7800 Pure hypercholesterolemia, unspecified: Secondary | ICD-10-CM | POA: Diagnosis present

## 2023-01-28 DIAGNOSIS — Z79899 Other long term (current) drug therapy: Secondary | ICD-10-CM

## 2023-01-28 DIAGNOSIS — I2510 Atherosclerotic heart disease of native coronary artery without angina pectoris: Secondary | ICD-10-CM | POA: Diagnosis present

## 2023-01-28 DIAGNOSIS — I739 Peripheral vascular disease, unspecified: Secondary | ICD-10-CM | POA: Diagnosis present

## 2023-01-28 DIAGNOSIS — I700 Atherosclerosis of aorta: Secondary | ICD-10-CM | POA: Diagnosis present

## 2023-01-28 DIAGNOSIS — I10 Essential (primary) hypertension: Secondary | ICD-10-CM

## 2023-01-28 DIAGNOSIS — E119 Type 2 diabetes mellitus without complications: Secondary | ICD-10-CM

## 2023-01-28 NOTE — Patient Instructions (Signed)
Medication Instructions:   Your physician recommends that you continue on your current medications as directed. Please refer to the Current Medication list given to you today.  *If you need a refill on your cardiac medications before your next appointment, please call your pharmacy*   Lab Work:  TODAY--LIPIDS  If you have labs (blood work) drawn today and your tests are completely normal, you will receive your results only by: MyChart Message (if you have MyChart) OR A paper copy in the mail If you have any lab test that is abnormal or we need to change your treatment, we will call you to review the results.    Follow-Up: At [facility], you and your health needs are our priority.  As part of our continuing mission to provide you with exceptional heart care, we have created designated Provider Care Teams.  These Care Teams include your primary Cardiologist (physician) and Advanced Practice Providers (APPs -  Physician Assistants and Nurse Practitioners) who all work together to provide you with the care you need, when you need it.  We recommend signing up for the patient portal called "MyChart".  Sign up information is provided on this After Visit Summary.  MyChart is used to connect with patients for Virtual Visits (Telemedicine).  Patients are able to view lab/test results, encounter notes, upcoming appointments, etc.  Non-urgent messages can be sent to your provider as well.   To learn more about what you can do with MyChart, go to [email or web address].    Your next appointment:   1 year(s)  Provider:   Heather E Pemberton, MD       

## 2023-01-29 LAB — LIPID PANEL
Chol/HDL Ratio: 4.1 ratio (ref 0.0–5.0)
Cholesterol, Total: 145 mg/dL (ref 100–199)
HDL: 35 mg/dL — ABNORMAL LOW (ref >39)
LDL Chol Calc (NIH): 61 mg/dL (ref 0–99)
Triglycerides: 314 mg/dL — ABNORMAL HIGH (ref 0–149)
VLDL Cholesterol Cal: 49 mg/dL — ABNORMAL HIGH (ref 5–40)

## 2023-03-17 ENCOUNTER — Telehealth: Payer: Self-pay | Admitting: Neurology

## 2023-03-17 NOTE — Telephone Encounter (Signed)
Sent mychart msg informing pt of appt change due to provider schedule change 

## 2023-03-25 IMAGING — MR MR HIP*R* W/O CM
4 of 6 series · 25 of 40 positions shown · non-contrast
Comparison: Plain films right hip 02/10/2022.

CLINICAL DATA: Right hip and leg pain for 6 weeks. The patient is
status post right hip replacement 12 years ago.

EXAM:
MR OF THE RIGHT HIP WITHOUT CONTRAST
TECHNIQUE: Multiplanar, multisequence MR imaging was performed. No intravenous
contrast was administered.

[Series 5: T1 · coronal · 4.0mm · 1.56mm/px · 7 of 38 slices shown]
[im 1/38]
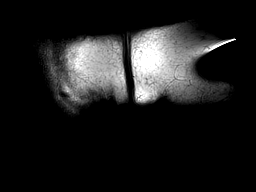
[im 7/38]
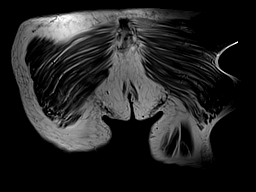
[im 13/38]
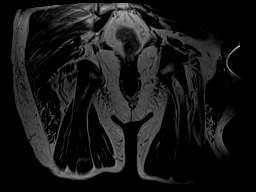
[im 19/38]
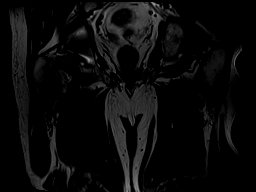
[im 25/38]
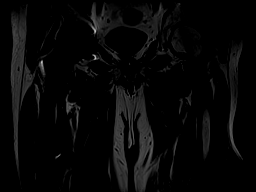
[im 31/38]
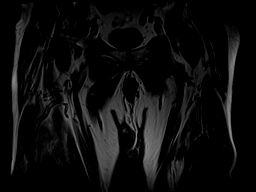
[im 38/38]
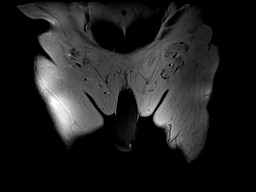

[Series 7: PD · axial · 4.0mm · 0.39mm/px · z∈[-134,+111]mm · 9 of 50 slices shown (1 of 3)]
[im 1/50]
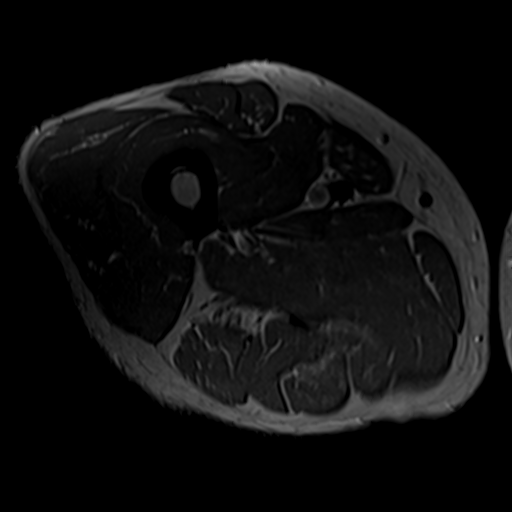
[im 7/50]
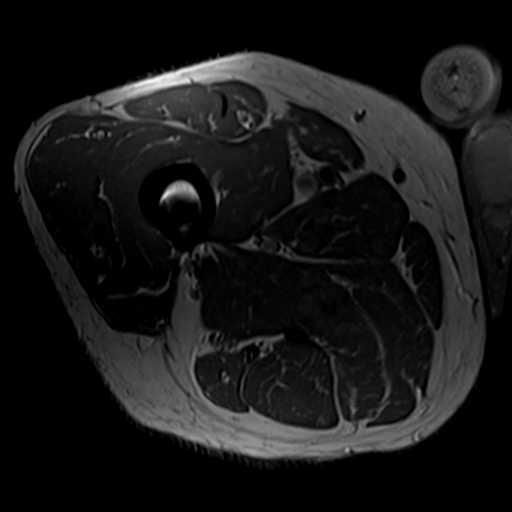
[im 13/50]
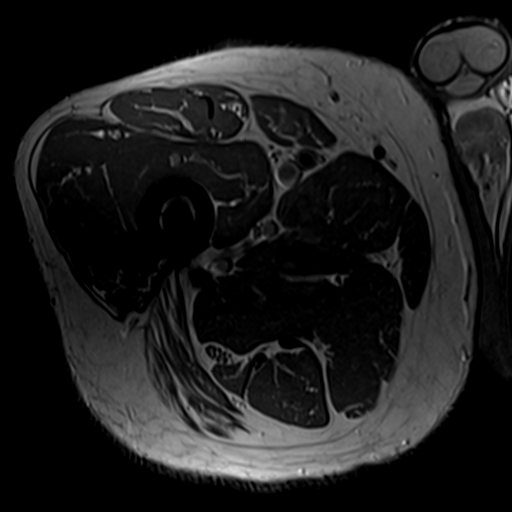
[im 19/50]
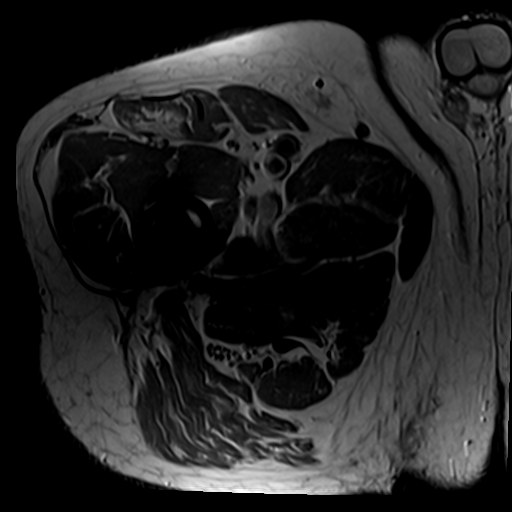
[im 25/50]
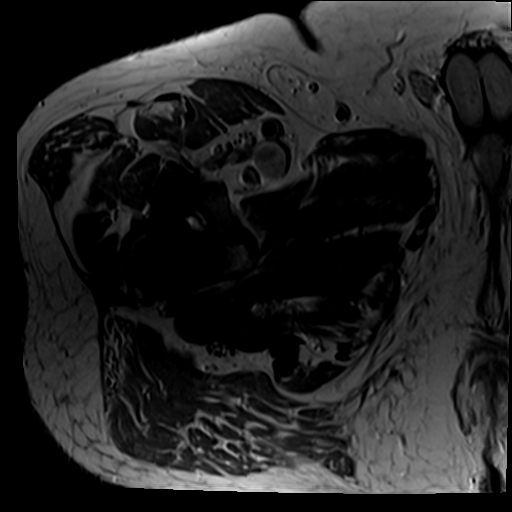
[im 31/50]
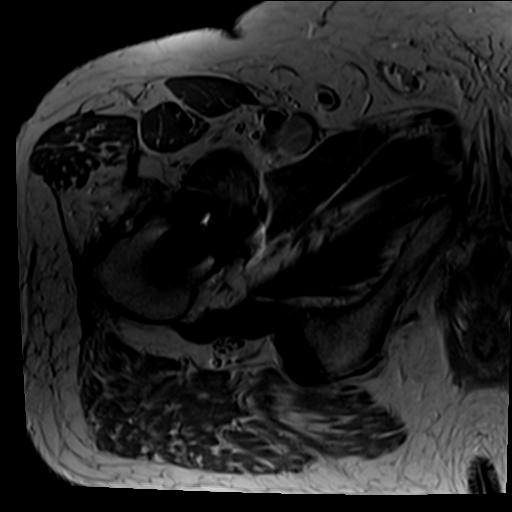
[im 37/50]
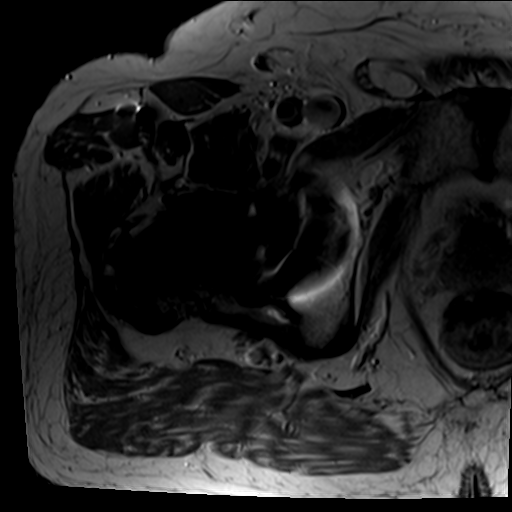
[im 43/50]
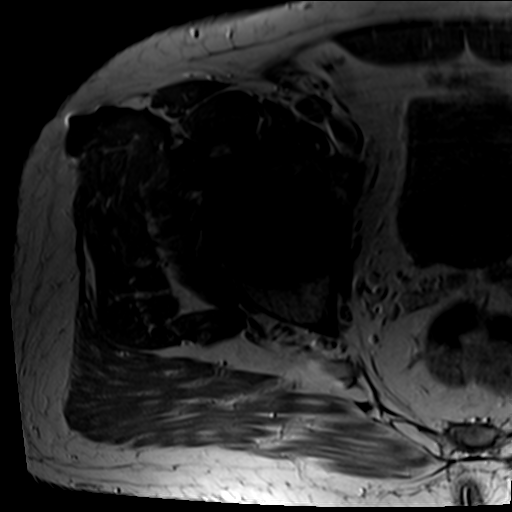
[im 50/50]
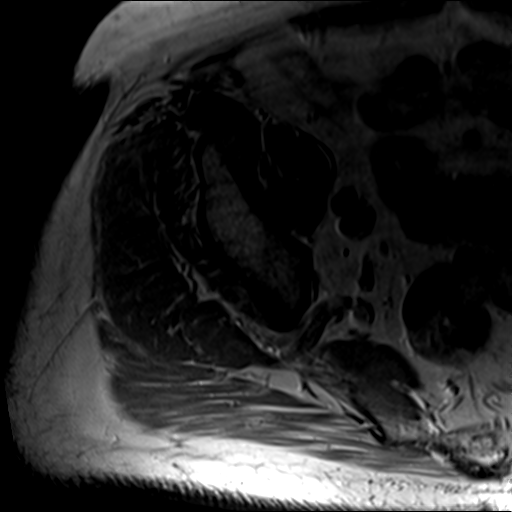

[Series 8: PD · coronal · 4.0mm · 0.45mm/px · 4 of 23 slices shown (2 of 3)]
[im 1/23]
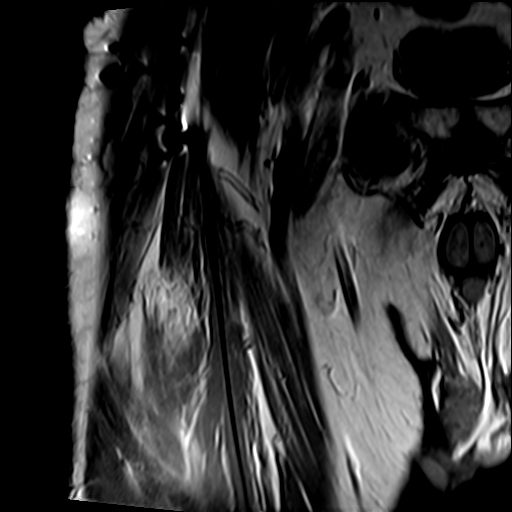
[im 8/23]
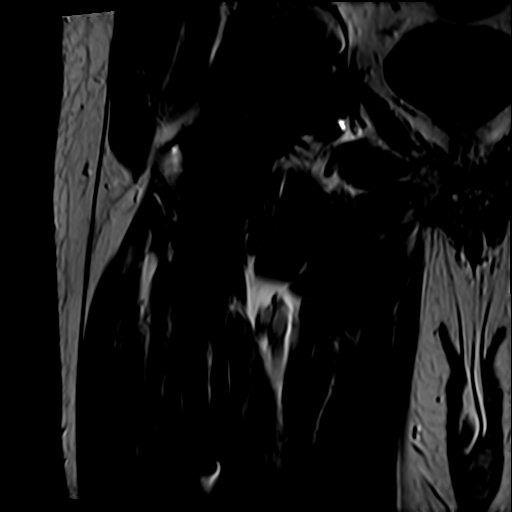
[im 15/23]
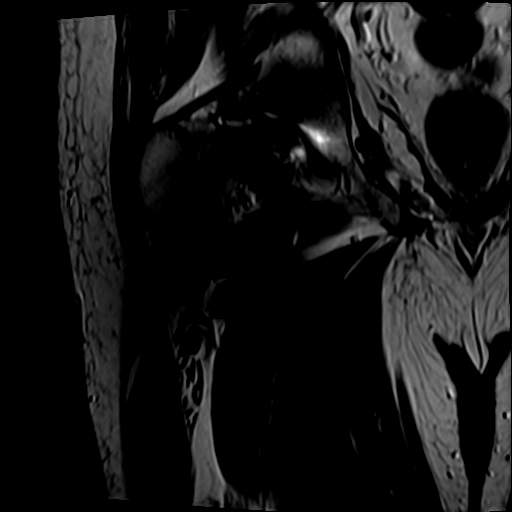
[im 23/23]
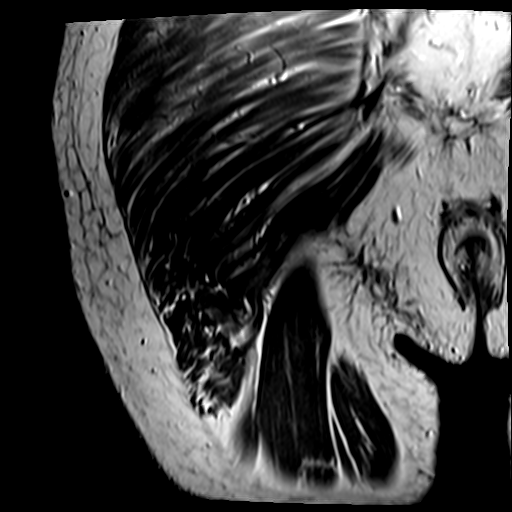

[Series 9: PD · sagittal · 4.0mm · 0.47mm/px · 5 of 29 slices shown (3 of 3)]
[im 1/29]
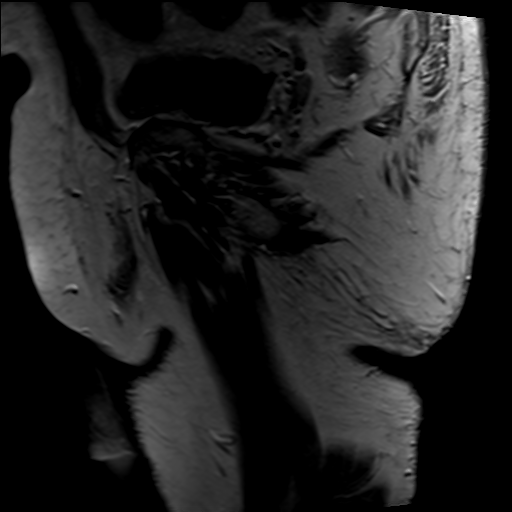
[im 8/29]
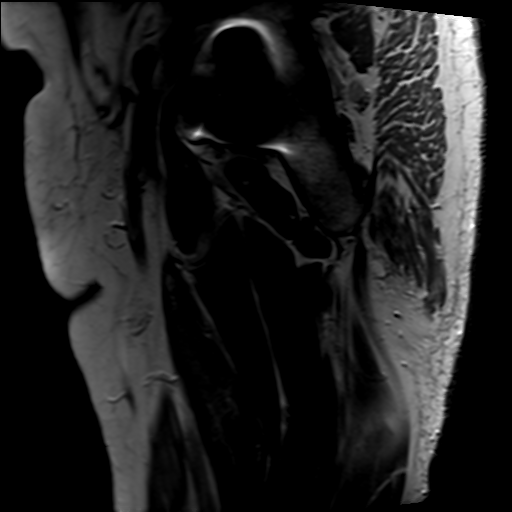
[im 15/29]
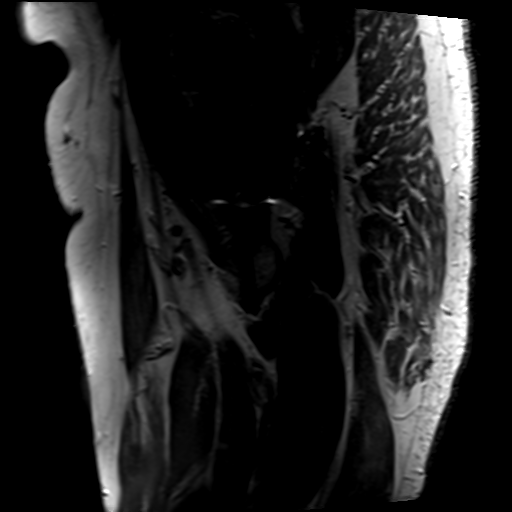
[im 22/29]
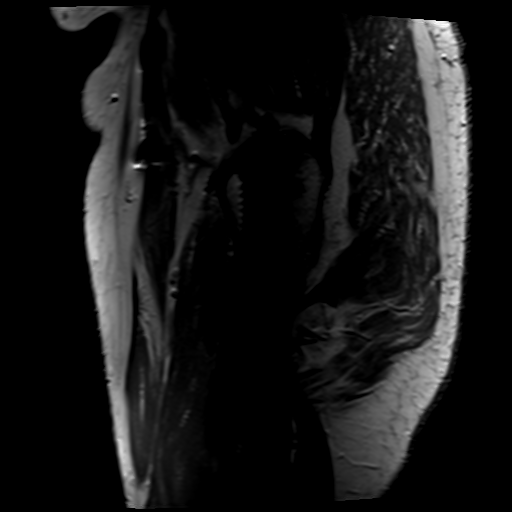
[im 29/29]
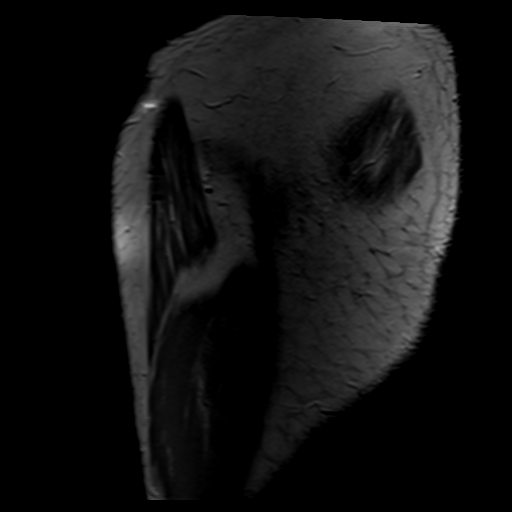

[25 of 40 positions shown; findings below may reference images not displayed]

FINDINGS: Artifact reduction techniques were used in this patient with a right
hip arthroplasty.

Bones: No acute bony or joint abnormality is identified. There is no
evidence of loosening of the patient's right hip replacement. No
bony erosive change is identified. No avascular necrosis of the left
femoral head.

Joint or bursal effusion

Joint effusion:  None.

Bursae: Negative.

Muscles and tendons

Muscles and tendons:  Intact and normal in appearance.

Other findings

Miscellaneous:   None.
IMPRESSION: Status post right hip replacement. No hardware complication or other
abnormality to explain the patient's symptoms is identified.

## 2023-04-21 ENCOUNTER — Other Ambulatory Visit: Payer: Self-pay

## 2023-04-21 ENCOUNTER — Emergency Department (HOSPITAL_COMMUNITY)
Admission: EM | Admit: 2023-04-21 | Discharge: 2023-04-21 | Discharge status: Against Medical Advice | Payer: Medicare Other | Attending: Emergency Medicine | Admitting: Emergency Medicine

## 2023-04-21 ENCOUNTER — Emergency Department (HOSPITAL_COMMUNITY): Payer: Medicare Other

## 2023-04-21 DIAGNOSIS — Z5321 Procedure and treatment not carried out due to patient leaving prior to being seen by health care provider: Secondary | ICD-10-CM | POA: Insufficient documentation

## 2023-04-21 DIAGNOSIS — S71131A Puncture wound without foreign body, right thigh, initial encounter: Secondary | ICD-10-CM | POA: Insufficient documentation

## 2023-04-21 DIAGNOSIS — W460XXA Contact with hypodermic needle, initial encounter: Secondary | ICD-10-CM | POA: Diagnosis not present

## 2023-04-21 NOTE — ED Triage Notes (Addendum)
Patient suspects needle from the syringe that he used this evening ( testosterone injection ) broke and stuck beneath the skin at right thigh .

## 2023-04-21 NOTE — ED Notes (Signed)
Pt handed this tech his pt stickers and stated "Ive had enough and want to go home". Pt taken OTF

## 2023-06-02 ENCOUNTER — Other Ambulatory Visit: Payer: Self-pay

## 2023-06-02 MED ORDER — VALSARTAN 160 MG PO TABS: 160.0000 mg | ORAL_TABLET | Freq: Every day | ORAL | 2 refills | Status: DC

## 2023-06-14 ENCOUNTER — Encounter: Payer: Self-pay | Admitting: Cardiology

## 2023-06-14 DIAGNOSIS — I2510 Atherosclerotic heart disease of native coronary artery without angina pectoris: Secondary | ICD-10-CM

## 2023-06-14 DIAGNOSIS — E785 Hyperlipidemia, unspecified: Secondary | ICD-10-CM

## 2023-06-15 ENCOUNTER — Telehealth: Payer: Self-pay | Admitting: Cardiology

## 2023-06-15 DIAGNOSIS — E785 Hyperlipidemia, unspecified: Secondary | ICD-10-CM

## 2023-06-15 DIAGNOSIS — I2510 Atherosclerotic heart disease of native coronary artery without angina pectoris: Secondary | ICD-10-CM

## 2023-06-15 MED ORDER — EVOLOCUMAB 140 MG/ML ~~LOC~~ SOAJ: 1.0000 mL | SUBCUTANEOUS | 3 refills | Status: AC

## 2023-06-15 NOTE — Telephone Encounter (Signed)
Refill already sent in today.

## 2023-06-15 NOTE — Telephone Encounter (Signed)
*  STAT* If patient is at the pharmacy, call can be transferred to refill team.   1. Which medications need to be refilled? (please list name of each medication and dose if known)   Evolocumab 140 MG/ML SOAJ    2. Which pharmacy/location (including street and city if local pharmacy) is medication to be sent to? [id number] - [place], [place] - [address]    3. Do they need a 30 day or 90 day supply? 90 day

## 2023-07-06 ENCOUNTER — Other Ambulatory Visit: Payer: Self-pay | Admitting: Family Medicine

## 2023-07-06 DIAGNOSIS — Z136 Encounter for screening for cardiovascular disorders: Secondary | ICD-10-CM

## 2023-07-07 ENCOUNTER — Telehealth: Payer: Self-pay | Admitting: *Deleted

## 2023-07-07 NOTE — Telephone Encounter (Signed)
Please ret PT call regarding LCS. His # is [phone]

## 2023-07-07 NOTE — Telephone Encounter (Signed)
Spoke with Kristopher Rowe. He states that his PCP Dr Tracie Harrier has referred him for lung cancer screening. I explained to Kristopher Rowe the CMS age guidelines for LCS. Kristopher Rowe is interested in the self pay option when that is approved and will call us back at a later date to check on this.

## 2023-07-16 ENCOUNTER — Ambulatory Visit
Admission: RE | Admit: 2023-07-16 | Discharge: 2023-07-16 | Disposition: A | Discharge status: Home / Self Care | Payer: Medicare Other | Source: Ambulatory Visit | Attending: Family Medicine | Admitting: Family Medicine

## 2023-07-16 DIAGNOSIS — Z136 Encounter for screening for cardiovascular disorders: Secondary | ICD-10-CM

## 2023-07-24 ENCOUNTER — Other Ambulatory Visit: Payer: Self-pay

## 2023-07-24 MED ORDER — AMLODIPINE BESYLATE 5 MG PO TABS: 5.0000 mg | ORAL_TABLET | Freq: Every day | ORAL | 1 refills | Status: AC

## 2023-07-24 MED ORDER — HYDROCHLOROTHIAZIDE 12.5 MG PO CAPS: 12.5000 mg | ORAL_CAPSULE | Freq: Every day | ORAL | 1 refills | Status: DC

## 2023-07-24 NOTE — Addendum Note (Signed)
Addended by: Margaret Pyle D on: 07/24/2023 08:21 AM   Modules accepted: Orders

## 2024-01-19 ENCOUNTER — Ambulatory Visit: Payer: Medicare Other | Admitting: Neurology

## 2024-01-21 ENCOUNTER — Encounter: Payer: Self-pay | Admitting: Neurology

## 2024-01-21 ENCOUNTER — Ambulatory Visit (INDEPENDENT_AMBULATORY_CARE_PROVIDER_SITE_OTHER): Payer: Medicare Other | Admitting: Neurology

## 2024-01-21 VITALS — BP 121/73 | HR 69 | Ht 69.0 in | Wt 200.0 lb

## 2024-01-21 DIAGNOSIS — R419 Unspecified symptoms and signs involving cognitive functions and awareness: Secondary | ICD-10-CM

## 2024-01-21 NOTE — Patient Instructions (Signed)
 Continue current medications  Continue to follow up with PCP  Return as needed

## 2024-01-21 NOTE — Progress Notes (Signed)
 [facility]  PATIENT: Kristopher Rowe DOB: 1946-01-14  REQUESTING CLINICIAN: Scott-Lowe, Campbell Lerner, MD HISTORY FROM: Patient  REASON FOR VISIT: Memory complaints    HISTORICAL  CHIEF COMPLAINT:  Chief Complaint  Patient presents with   Follow-up    Pt in 13, here alone  Pt is following up on [facility].    INTERVAL HISTORY 01/21/2024:  Patient presents today for follow-up, last visit was a year ago, since then he has been doing well.  His ATN profile was normal, no signs of Alzheimer disease biomarkers.  He tells me that he is doing okay; he is planning to move to [place] this June to be near his daughter.  No other complaints, no other concerns.   HISTORY OF PRESENT ILLNESS:  This is a 78 year old gentleman with past medical history including hypertension, hyperlipidemia, diabetes mellitus, who is presenting with memory problem.  Patient reports initially having an event 15 years ago.  He was at work and could not remember the name of his old friend, someone that he knows for many years and used to play pickle ball with him.  Due to the initial concern he presented to his neurologist, had full memory/cognition workup and it was negative.  He reported last year he started having difficulty remembering names.  Again these are not names of family members, but  names of people that he knows, name of famous people.  On top of that, he feels also that his short-term memory is getting worse.  He does live alone, is independent in all ADLs and IADLs.  He does drive, denies any recent accident, stating occasionally he has  words finding difficulty.  He does exercise and read a lot, he is a avid reader.    TBI:   No past history of TBI Stroke:   no past history of stroke Seizures:   no past history of seizures Sleep:   Has sleep apnea but not using his CPAP machine  Mood:  patient denies anxiety and depression Family history of Dementia:  Denies  Functional status: independent in  all ADLs and IADLs Patient lives alone. Cooking: yes Cleaning: yes Shopping: yes Bathing: yes Toileting: yes Driving: yes Bills: yes  Ever left the stove on by accident?: Denies  Forget how to use items around the house?: Denies  Getting lost going to familiar places?: Denies  Forgetting loved ones names?: Denies  Word finding difficulty? occasional  Sleep: Not good, has sleep apnea an restlesness    OTHER MEDICAL CONDITIONS: CAD, Hypertension, Hyperlipidemia, Diabetes   REVIEW OF SYSTEMS: Full 14 system review of systems performed and negative with exception of: As noted in the HPI   ALLERGIES: Allergies  Allergen Reactions   Other Hives    Allergic to a medication that begins with a "P"   Penicillins     HOME MEDICATIONS: Outpatient Medications Prior to Visit  Medication Sig Dispense Refill   ACCU-CHEK GUIDE test strip AS DIRECTED IN VITRO ONCE A DAY 90 DAYS     Accu-Chek Softclix Lancets lancets See admin instructions.     allopurinol (ZYLOPRIM) 300 MG tablet Take 300 mg by mouth daily.     amLODipine (NORVASC) 5 MG tablet Take 1 tablet (5 mg total) by mouth daily. 90 tablet 1   aspirin EC 81 MG tablet Take 81 mg by mouth daily. Swallow whole.     atenolol (TENORMIN) 50 MG tablet Take 1 tablet (50 mg total) by mouth daily. 90 tablet 3   cholecalciferol (  VITAMIN D) 25 MCG (1000 UNIT) tablet Take 1,000 Units by mouth daily.     Evolocumab 140 MG/ML SOAJ Inject 140 mg into the skin every 14 (fourteen) days. 6 mL 3   fenofibrate 160 MG tablet Take 1 tablet (160 mg total) by mouth daily. 90 tablet 3   indomethacin (INDOCIN) 25 MG capsule Take 25 mg by mouth 2 (two) times daily. Take 1 capsule twice daily with food or milk for 14 days     JARDIANCE 10 MG TABS tablet Take 10 mg by mouth daily.     RYBELSUS 7 MG TABS Take by mouth.     valsartan (DIOVAN) 160 MG tablet Take 1 tablet (160 mg total) by mouth daily. 90 tablet 2   varenicline (CHANTIX) 1 MG tablet Take 1 mg by  mouth daily.     VASCEPA 1 g capsule Take 2 capsules (2 g total) by mouth 2 (two) times daily. 120 capsule 11   BREO ELLIPTA 100-25 MCG/ACT AEPB TAKE 1 PUFF BY MOUTH EVERY DAY 60 each 3   fluticasone (FLONASE) 50 MCG/ACT nasal spray Place into both nostrils.     hydrochlorothiazide (MICROZIDE) 12.5 MG capsule Take 1 capsule (12.5 mg total) by mouth daily. 90 capsule 1   metFORMIN (GLUCOPHAGE) 1000 MG tablet Take 1,000 mg by mouth 2 (two) times daily.     No facility-administered medications prior to visit.    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HCC)    ED (erectile dysfunction)    Gout    Hypertension    Melanoma (HCC)    OSA (obstructive sleep apnea)    PAD (peripheral artery disease) (HCC)    Tobacco use     PAST SURGICAL HISTORY: Past Surgical History:  Procedure Laterality Date   post removal (oral sugery)  12/2022    FAMILY HISTORY: Family History  Problem Relation Age of Onset   Other Mother        RESPIRATORY INFECTION   Hodgkin's lymphoma Father     SOCIAL HISTORY: Social History   Socioeconomic History   Marital status: Divorced    Spouse name: Not on file   Number of children: Not on file   Years of education: Not on file   Highest education level: Not on file  Occupational History   Not on file  Tobacco Use   Smoking status: Some Days    Current packs/day: 2.00    Average packs/day: 2.0 packs/day for 50.0 years (100.0 ttl pk-yrs)    Types: Cigarettes   Smokeless tobacco: Never  Vaping Use   Vaping status: Never Used  Substance and Sexual Activity   Alcohol use: Not Currently   Drug use: Not on file   Sexual activity: Not on file  Other Topics Concern   Not on file  Social History Narrative   Not on file   Social Drivers of Health   Financial Resource Strain: Not on file  Food Insecurity: Not on file  Transportation Needs: Not on file   Physical Activity: Not on file  Stress: Not on file  Social Connections: Not on file  Intimate Partner Violence: Not on file    PHYSICAL EXAM  GENERAL EXAM/CONSTITUTIONAL: Vitals:  Vitals:   01/21/24 1541  BP: 121/73  Pulse: 69  Weight: 200 lb (90.7 kg)  Height: 5\' 9"  (1.753 m)   Body mass index is 29.53 kg/m. Wt Readings  from Last 3 Encounters:  01/21/24 200 lb (90.7 kg)  01/28/23 205 lb 6.4 oz (93.2 kg)  01/20/23 206 lb 8 oz (93.7 kg)   Patient is in no distress; well developed, nourished and groomed; neck is supple   EYES: Visual fields full to confrontation, Extraocular movements intacts,   MUSCULOSKELETAL: Gait, strength, tone, movements noted in Neurologic exam below  NEUROLOGIC: MENTAL STATUS:      No data to display         awake, alert, oriented to person, place and time recent and remote memory intact normal attention and concentration language fluent, comprehension intact, naming intact fund of knowledge appropriate     01/21/2024    3:43 PM 01/20/2023   10:51 AM  Montreal Cognitive Assessment   Visuospatial/ Executive (0/5) 5 5  Naming (0/3) 3 3  Attention: Read list of digits (0/2) 2 2  Attention: Read list of letters (0/1) 1 1  Attention: Serial 7 subtraction starting at 100 (0/3) 3 3  Language: Repeat phrase (0/2) 2 2  Language : Fluency (0/1) 1 1  Abstraction (0/2) 2 2  Delayed Recall (0/5) 3 5  Orientation (0/6) 4 6  Total 26 30    CRANIAL NERVE:  2nd, 3rd, 4th, 6th- visual fields full to confrontation, extraocular muscles intact, no nystagmus 5th - facial sensation symmetric 7th - facial strength symmetric 8th - hearing intact 9th - palate elevates symmetrically, uvula midline 11th - shoulder shrug symmetric 12th - tongue protrusion midline  MOTOR:  normal bulk and tone, full strength in the BUE, BLE  SENSORY:  normal and symmetric to light touch  COORDINATION:  finger-nose-finger, fine finger movements  normal  GAIT/STATION:  normal   DIAGNOSTIC DATA (LABS, IMAGING, TESTING) - I reviewed patient records, labs, notes, testing and imaging myself where available.  Lab Results  Component Value Date   WBC 11.6 (H) 11/15/2021   HGB 16.7 11/15/2021   HCT 49.0 11/15/2021   MCV 95.3 11/15/2021   PLT 384 11/15/2021      Component Value Date/Time   NA 141 01/22/2022 0802   K 4.9 01/22/2022 0802   CL 101 01/22/2022 0802   CO2 24 01/22/2022 0802   GLUCOSE 157 (H) 01/22/2022 0802   GLUCOSE 186 (H) 11/15/2021 2308   BUN 25 01/22/2022 0802   CREATININE 1.75 (H) 01/22/2022 0802   CALCIUM 11.3 (H) 01/22/2022 0802   PROT 7.2 01/22/2022 0802   ALBUMIN 5.0 (H) 01/22/2022 0802   AST 34 01/22/2022 0802   ALT 36 01/22/2022 0802   ALKPHOS 54 01/22/2022 0802   BILITOT 0.5 01/22/2022 0802   GFRNONAA 43 (L) 11/15/2021 2308   Lab Results  Component Value Date   CHOL 145 01/28/2023   HDL 35 (L) 01/28/2023   LDLCALC 61 01/28/2023   TRIG 314 (H) 01/28/2023   CHOLHDL 4.1 01/28/2023   No results found for: "HGBA1C" Lab Results  Component Value Date   VITAMINB12 472 01/20/2023   Lab Results  Component Value Date   TSH 1.300 01/20/2023   ATN profile: Normal    MRI Brain 12/16/2022 1. No acute intracranial abnormality. 2. Mild chronic microvascular ischemic changes of the white matter. 3. Moderate parenchymal volume loss.    ASSESSMENT AND PLAN  78 y.o. year old male with hypertension, hyperlipidemia, diabetes mellitus who is presenting with memory complaints mainly forgetting names.  Otherwise he is independent all actives of daily living, his MoCA is normal.  Informed patient that he likely has normal  cognition.  He is planning to move to [facility] this June.  Advised him to continue following up with his doctors, continue his current medication and return as needed.   1. Cognitive complaints with normal exam     Patient Instructions  Continue current medications  Continue to  follow up with PCP  Return as needed   No orders of the defined types were placed in this encounter.   No orders of the defined types were placed in this encounter.   Return if symptoms worsen or fail to improve.    Windell Norfolk, MD 01/21/2024, 4:37 PM  [facility] [address], [address] [place], [place] [address] [phone]

## 2024-01-25 ENCOUNTER — Ambulatory Visit: Payer: Medicare Other | Attending: Cardiology | Admitting: Cardiology

## 2024-01-25 ENCOUNTER — Encounter: Payer: Self-pay | Admitting: Cardiology

## 2024-01-25 VITALS — BP 102/60 | HR 54 | Ht 69.0 in | Wt 203.4 lb

## 2024-01-25 DIAGNOSIS — I739 Peripheral vascular disease, unspecified: Secondary | ICD-10-CM | POA: Insufficient documentation

## 2024-01-25 DIAGNOSIS — I2510 Atherosclerotic heart disease of native coronary artery without angina pectoris: Secondary | ICD-10-CM | POA: Diagnosis not present

## 2024-01-25 DIAGNOSIS — I10 Essential (primary) hypertension: Secondary | ICD-10-CM | POA: Diagnosis present

## 2024-01-25 MED ORDER — ATENOLOL 50 MG PO TABS: 25.0000 mg | ORAL_TABLET | Freq: Every day | ORAL | Status: AC

## 2024-01-25 NOTE — Patient Instructions (Signed)
   Follow-Up: At [facility], you and your health needs are our priority.  As part of our continuing mission to provide you with exceptional heart care, we have created designated Provider Care Teams.  These Care Teams include your primary Cardiologist (physician) and Advanced Practice Providers (APPs -  Physician Assistants and Nurse Practitioners) who all work together to provide you with the care you need, when you need it.   Your next appointment:   As needed   Provider:   Elder Negus, MD     Other Instructions   1st Floor: - Lobby - Registration  - Pharmacy  - Lab - Cafe  2nd Floor: - PV Lab - Diagnostic Testing (echo, CT, nuclear med)  3rd Floor: - Vacant  4th Floor: - TCTS (cardiothoracic surgery) - AFib Clinic - Structural Heart Clinic - Vascular Surgery  - Vascular Ultrasound  5th Floor: - HeartCare Cardiology (general and EP) - Clinical Pharmacy for coumadin, hypertension, lipid, weight-loss medications, and med management appointments    Valet parking services will be available as well.

## 2024-01-25 NOTE — Progress Notes (Signed)
 Cardiology Office Note:  .   Date:  01/25/2024  ID:  Kristopher Rowe, DOB Dec 07, 1945, MRN [id number] PCP: Aliene Beams, MD  [facility] Providers Cardiologist:  Truett Mainland, MD PCP: Aliene Beams, MD  Chief Complaint  Patient presents with   Coronary Artery Disease      History of Present Illness: .    Kristopher Rowe is a 78 y.o. male with hypertension, hyperlipidemia, type 2 diabetes mellitus, CAD (remote history of PCI to RCA and OM1), PAD (PTCA and stenting to left common iliac artery 2016), OSA, former smoker, gout  Patient moved from [place] to [place] in 2022.  He is getting ready to move to [facility] to be closed to his daughter, in May 2025.  He recently had a fall in past, injured a few ribs, but realized he needed to be close to his daughter.  He denies any chest pain, shortness of breath, claudication symptoms.  He is compliant with his medical therapy.  He recently underwent lab work with PCP, which is not available to me.  He states that triglycerides do remain elevated, but is hoping that recent initiation of Rybelsus improves his diabetes on his triglyceride numbers.  He states that his creatinine is staying stable.  Vitals:   01/25/24 0816  BP: 102/60  Pulse: (!) 54  SpO2: 98%     ROS:  Review of Systems  Cardiovascular:  Negative for chest pain, dyspnea on exertion, leg swelling, palpitations and syncope.     Studies Reviewed: Kristopher Rowe Kitchen       EKG 01/25/2024: Sinus rhythm with 1st degree A-V block Possible Inferior infarct , age undetermined No previous ECGs available    Independently interpreted 02/2023: Chol 111, TG 303, HDL 40, LDL 26 HbA1C 6.5% Hb 15.2 Cr 1.75 TSH 1.4    Physical Exam:   Physical Exam Vitals and nursing note reviewed.  Constitutional:      General: He is not in acute distress. Neck:     Vascular: No JVD.  Cardiovascular:     Rate and Rhythm: Normal rate and regular rhythm.     Heart sounds: Normal heart sounds. No  murmur heard. Pulmonary:     Effort: Pulmonary effort is normal.     Breath sounds: Normal breath sounds. No wheezing or rales.  Musculoskeletal:     Right lower leg: No edema.     Left lower leg: No edema.      VISIT DIAGNOSES:   ICD-10-CM   1. Coronary artery disease involving native coronary artery of native heart without angina pectoris  I25.10 EKG 12-Lead    2. PAD (peripheral artery disease) (HCC)  I73.9     3. Essential hypertension  I10        ASSESSMENT AND PLAN: .    [name] is a 78 y.o. male with hypertension, hyperlipidemia, type 2 diabetes mellitus, CAD (remote history of PCI to RCA and OM1), PAD (PTCA and stenting to left common iliac artery 2016), OSA, former smoker, gout  CAD: Remote history of PCI's.  No anginal symptoms. Continue aspirin 81 mg daily. Not on statin, but is on Repatha, fenofibrate. LDL very well-controlled.  Triglyceride remains elevated historically. Recommend repeat fasting lipid panel in 6 months after moving to [facility]. Blood pressure well-controlled with amlodipine 5 mg daily, atenolol 25 mg twice daily, valsartan 160 mg daily.  PAD: Continue aspirin, Repatha, valsartan.  Type II DM: A1c 6.4%. Continue follow-up with PCP.       F/u as needed  Signed, Elder Negus, MD

## 2024-01-25 NOTE — Addendum Note (Signed)
 Addended by: Neita Goodnight B on: 01/25/2024 12:46 PM   Modules accepted: Orders

## 2024-05-11 ENCOUNTER — Other Ambulatory Visit: Payer: Self-pay

## 2024-05-11 MED ORDER — VALSARTAN 160 MG PO TABS: 160.0000 mg | ORAL_TABLET | Freq: Every day | ORAL | 2 refills | Status: AC
